# Patient Record
Sex: Female | Born: 1995 | Race: Black or African American | Hispanic: No | Marital: Married | Smoking: Never smoker
Health system: Southern US, Community
[De-identification: ages and names within clinical notes are randomized; demographics above are authoritative.]

## PROBLEM LIST (undated history)

## (undated) ENCOUNTER — Emergency Department (HOSPITAL_COMMUNITY): Payer: Medicaid Other

## (undated) DIAGNOSIS — D649 Anemia, unspecified: Secondary | ICD-10-CM

## (undated) DIAGNOSIS — T7840XA Allergy, unspecified, initial encounter: Secondary | ICD-10-CM

## (undated) HISTORY — DX: Anemia, unspecified: D64.9

## (undated) HISTORY — DX: Allergy, unspecified, initial encounter: T78.40XA

---
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
## 2018-01-15 DIAGNOSIS — Z3490 Encounter for supervision of normal pregnancy, unspecified, unspecified trimester: Secondary | ICD-10-CM | POA: Insufficient documentation

## 2019-06-15 ENCOUNTER — Ambulatory Visit
Admission: EM | Admit: 2019-06-15 | Discharge: 2019-06-15 | Disposition: A | Discharge status: Home / Self Care | Payer: Medicaid Other

## 2019-06-15 DIAGNOSIS — O219 Vomiting of pregnancy, unspecified: Secondary | ICD-10-CM | POA: Diagnosis not present

## 2019-06-15 MED ORDER — DOXYLAMINE-PYRIDOXINE 10-10 MG PO TBEC: DELAYED_RELEASE_TABLET | ORAL | 0 refills | Status: AC

## 2019-06-15 MED ORDER — ONDANSETRON 4 MG PO TBDP
4.0000 mg | ORAL_TABLET | Freq: Once | ORAL | Status: AC
  Administered 2019-06-15: 4 mg via ORAL

## 2019-06-15 NOTE — ED Triage Notes (Signed)
Pt states had a positive pregnancy test two weeks ago, been nausea ever since. States unable to eat or drink anything. Denies vomiting. States her first OB appt is Monday.

## 2019-06-15 NOTE — ED Provider Notes (Signed)
[facility]    CSN: [id number] Arrival date & time: 06/15/19  1935      History   Chief Complaint Chief Complaint  Patient presents with  . Nausea    HPI Miranda Harrison is a 24 y.o. female.   24 year old female comes in for nausea, vomiting after positive pregnancy test 2 weeks ago. She has appointment scheduled with OB in 4 days, was unable to be seen sooner. States had abdominal pain when vomiting. Otherwise no abdominal pain at rest. Denies current abdominal pain. Denies vaginal discharge, bleeding. Denies urinary symptoms such as frequency, dysuria, hematuria. LMP 04/02/2019.      Past Medical History:  Diagnosis Date  . Allergy     Patient Active Problem List   Diagnosis Date Noted  . Chronic daily headache 09/10/2015  . Sleep difficulties 09/10/2015    History reviewed. No pertinent surgical history.  OB History   No obstetric history on file.      Home Medications    Prior to Admission medications   Medication Sig Start Date End Date Taking? Authorizing Provider  Prenatal Vit-Fe Fumarate-FA (PRENATAL VITAMIN PO) Take by mouth.   Yes [provider]  Doxylamine-Pyridoxine 10-10 MG TBEC Two tablets at bedtime on day 1 and 2; if symptoms persist, take 1 tablet in morning and 2 tablets at bedtime on day 3; if symptoms persist, may increase to 1 tablet in morning, 1 tablet mid-afternoon, and 2 tablets at bedtime on day 4 (maximum: doxylamine 40 mg/pyridoxine 40 mg (4 tablets) per day). 06/15/19   Ok Edwards, PA-C    Family History Family History  Problem Relation Age of Onset  . Depression Mother   . Anxiety disorder Maternal Aunt   . Bipolar disorder Maternal Grandmother     Social History Social History   Tobacco Use  . Smoking status: Never Smoker  . Smokeless tobacco: Never Used  Substance Use Topics  . Alcohol use: No  . Drug use: No     Allergies   Patient has no known allergies.   Review of Systems Review of  Systems  Reason unable to perform ROS: See HPI as above.     Physical Exam Triage Vital Signs ED Triage Vitals [06/15/19 1947]  Enc Vitals Group     BP 103/68     Pulse Rate 88     Resp 16     Temp 98.4 F (36.9 C)     Temp Source Oral     SpO2 99 %     Weight      Height      Head Circumference      Peak Flow      Pain Score 0     Pain Loc      Pain Edu?      Excl. in [place]?    No data found.  Updated Vital Signs BP 103/68 (BP Location: Left Arm)   Pulse 88   Temp 98.4 F (36.9 C) (Oral)   Resp 16   LMP 04/02/2019   SpO2 99%   Visual Acuity Right Eye Distance:   Left Eye Distance:   Bilateral Distance:    Right Eye Near:   Left Eye Near:    Bilateral Near:     Physical Exam Constitutional:      General: She is not in acute distress.    Appearance: She is well-developed. She is not ill-appearing, toxic-appearing or diaphoretic.  HENT:     Head:  Normocephalic and atraumatic.  Eyes:     Conjunctiva/sclera: Conjunctivae normal.     Pupils: Pupils are equal, round, and reactive to light.  Cardiovascular:     Rate and Rhythm: Normal rate and regular rhythm.  Pulmonary:     Effort: Pulmonary effort is normal. No respiratory distress.     Comments: LCTAB Abdominal:     General: Bowel sounds are normal.     Palpations: Abdomen is soft.     Tenderness: There is no abdominal tenderness. There is no right CVA tenderness, left CVA tenderness, guarding or rebound.  Musculoskeletal:     Cervical back: Normal range of motion and neck supple.  Skin:    General: Skin is warm and dry.  Neurological:     Mental Status: She is alert and oriented to person, place, and time.  Psychiatric:        Behavior: Behavior normal.        Judgment: Judgment normal.      UC Treatments / Results  Labs (all labs ordered are listed, but only abnormal results are displayed) Labs Reviewed - No data to display  EKG   Radiology No results found.  Procedures Procedures  (including critical care time)  Medications Ordered in UC Medications  ondansetron (ZOFRAN-ODT) disintegrating tablet 4 mg (4 mg Oral Given 06/15/19 1950)    Initial Impression / Assessment and Plan / UC Course  I have reviewed the triage vital signs and the nursing notes.  Pertinent labs & imaging results that were available during my care of the patient were reviewed by me and considered in my medical decision making (see chart for details).    Zofran given by RN per protocol with good relief. Abdomen soft, nontender. Will have patient start diclegis, discussed using otc version if not covered by insurance. Return precautions given. Otherwise, patient to follow up as scheduled with OB for further evaluation and management needed.  Final Clinical Impressions(s) / UC Diagnoses   Final diagnoses:  Nausea and vomiting during pregnancy   ED Prescriptions    Medication Sig Dispense Auth. Provider   Doxylamine-Pyridoxine 10-10 MG TBEC Two tablets at bedtime on day 1 and 2; if symptoms persist, take 1 tablet in morning and 2 tablets at bedtime on day 3; if symptoms persist, may increase to 1 tablet in morning, 1 tablet mid-afternoon, and 2 tablets at bedtime on day 4 (maximum: doxylamine 40 mg/pyridoxine 40 mg (4 tablets) per day). 30 tablet Belinda Fisher, PA-C     PDMP not reviewed this encounter.   Belinda Fisher, PA-C 06/15/19 2115

## 2019-06-15 NOTE — Discharge Instructions (Signed)
Start diclegis as directed. Keep hydrated, urine should be clear to pale yellow in color. Follow up with OB as scheduled for further evaluation needed. If abdominal pain, nausea/vomiting not  controlled, weakness, dizziness, vaginal bleeding to to the women's ED for further evaluation.

## 2019-06-18 ENCOUNTER — Inpatient Hospital Stay (HOSPITAL_COMMUNITY)
Admission: AD | Admit: 2019-06-18 | Discharge: 2019-06-19 | Disposition: A | Discharge status: Home / Self Care | Payer: Medicaid Other | Attending: Obstetrics & Gynecology | Admitting: Obstetrics & Gynecology

## 2019-06-18 ENCOUNTER — Encounter (HOSPITAL_COMMUNITY): Payer: Self-pay

## 2019-06-18 ENCOUNTER — Inpatient Hospital Stay (HOSPITAL_COMMUNITY): Payer: Medicaid Other

## 2019-06-18 DIAGNOSIS — Z3A01 Less than 8 weeks gestation of pregnancy: Secondary | ICD-10-CM | POA: Diagnosis not present

## 2019-06-18 DIAGNOSIS — O98811 Other maternal infectious and parasitic diseases complicating pregnancy, first trimester: Secondary | ICD-10-CM | POA: Insufficient documentation

## 2019-06-18 DIAGNOSIS — B373 Candidiasis of vulva and vagina: Secondary | ICD-10-CM | POA: Insufficient documentation

## 2019-06-18 DIAGNOSIS — Z3A11 11 weeks gestation of pregnancy: Secondary | ICD-10-CM | POA: Diagnosis not present

## 2019-06-18 DIAGNOSIS — O219 Vomiting of pregnancy, unspecified: Secondary | ICD-10-CM | POA: Diagnosis not present

## 2019-06-18 DIAGNOSIS — O26891 Other specified pregnancy related conditions, first trimester: Secondary | ICD-10-CM | POA: Insufficient documentation

## 2019-06-18 DIAGNOSIS — O210 Mild hyperemesis gravidarum: Secondary | ICD-10-CM | POA: Insufficient documentation

## 2019-06-18 DIAGNOSIS — B3731 Acute candidiasis of vulva and vagina: Secondary | ICD-10-CM

## 2019-06-18 DIAGNOSIS — Z3491 Encounter for supervision of normal pregnancy, unspecified, first trimester: Secondary | ICD-10-CM

## 2019-06-18 DIAGNOSIS — Z79899 Other long term (current) drug therapy: Secondary | ICD-10-CM | POA: Insufficient documentation

## 2019-06-18 LAB — WET PREP, GENITAL
Clue Cells Wet Prep HPF POC: NONE SEEN
Sperm: NONE SEEN
Trich, Wet Prep: NONE SEEN

## 2019-06-18 LAB — URINALYSIS, ROUTINE W REFLEX MICROSCOPIC
Bilirubin Urine: NEGATIVE
Glucose, UA: NEGATIVE mg/dL
Hgb urine dipstick: NEGATIVE
Ketones, ur: 80 mg/dL — AB
Leukocytes,Ua: NEGATIVE
Nitrite: NEGATIVE
Protein, ur: NEGATIVE mg/dL
Specific Gravity, Urine: 1.027 (ref 1.005–1.030)
pH: 5 (ref 5.0–8.0)

## 2019-06-18 LAB — CBC
HCT: 34.6 % — ABNORMAL LOW (ref 36.0–46.0)
Hemoglobin: 11.3 g/dL — ABNORMAL LOW (ref 12.0–15.0)
MCH: 27.6 pg (ref 26.0–34.0)
MCHC: 32.7 g/dL (ref 30.0–36.0)
MCV: 84.4 fL (ref 80.0–100.0)
Platelets: 317 10*3/uL (ref 150–400)
RBC: 4.1 MIL/uL (ref 3.87–5.11)
RDW: 12.2 % (ref 11.5–15.5)
WBC: 6.2 10*3/uL (ref 4.0–10.5)
nRBC: 0 % (ref 0.0–0.2)

## 2019-06-18 LAB — ABO/RH: ABO/RH(D): O POS

## 2019-06-18 LAB — POCT PREGNANCY, URINE: Preg Test, Ur: POSITIVE — AB

## 2019-06-18 LAB — HCG, QUANTITATIVE, PREGNANCY: hCG, Beta Chain, Quant, S: 78807 m[IU]/mL — ABNORMAL HIGH (ref <5)

## 2019-06-18 MED ORDER — FAMOTIDINE IN NACL 20-0.9 MG/50ML-% IV SOLN
20.0000 mg | Freq: Once | INTRAVENOUS | Status: AC
  Administered 2019-06-18: 20 mg via INTRAVENOUS
  Filled 2019-06-18: qty 50

## 2019-06-18 MED ORDER — PROMETHAZINE HCL 25 MG/ML IJ SOLN
25.0000 mg | Freq: Once | INTRAMUSCULAR | Status: AC
  Administered 2019-06-18: 25 mg via INTRAVENOUS
  Filled 2019-06-18: qty 1

## 2019-06-18 MED ORDER — TERCONAZOLE 0.4 % VA CREA: 1.0000 | TOPICAL_CREAM | Freq: Every day | VAGINAL | 0 refills | Status: AC

## 2019-06-18 MED ORDER — LACTATED RINGERS IV BOLUS
1000.0000 mL | Freq: Once | INTRAVENOUS | Status: AC
  Administered 2019-06-18: 1000 mL via INTRAVENOUS

## 2019-06-18 MED ORDER — METOCLOPRAMIDE HCL 5 MG PO TABS: 5.0000 mg | ORAL_TABLET | Freq: Three times a day (TID) | ORAL | 1 refills | Status: DC | PRN

## 2019-06-18 NOTE — MAU Note (Signed)
Patient reports she was seen in urgent care for N/V on 2/25.  Unable to fill her prescription d/t her medicaid not going through yet.  Tried ginger and OTC meds without relief.  Unable to keep anything down today-feeling very weak.  Denies VB.  Some abdominal cramping in her lower abdomen.

## 2019-06-18 NOTE — Discharge Instructions (Signed)
Safe Medications in Pregnancy   Acne: Benzoyl Peroxide Salicylic Acid  Backache/Headache: Tylenol: 2 regular strength every 4 hours OR              2 Extra strength every 6 hours  Colds/Coughs/Allergies: Benadryl (alcohol free) 25 mg every 6 hours as needed Breath right strips Claritin Cepacol throat lozenges Chloraseptic throat spray Cold-Eeze- up to three times per day Cough drops, alcohol free Flonase (by prescription only) Guaifenesin Mucinex Robitussin DM (plain only, alcohol free) Saline nasal spray/drops Sudafed (pseudoephedrine) & Actifed ** use only after [redacted] weeks gestation and if you do not have high blood pressure Tylenol Vicks Vaporub Zinc lozenges Zyrtec   Constipation: Colace Ducolax suppositories Fleet enema Glycerin suppositories Metamucil Milk of magnesia Miralax Senokot Smooth move tea  Diarrhea: Kaopectate Imodium A-D  *NO pepto Bismol  Hemorrhoids: Anusol Anusol HC Preparation H Tucks  Indigestion: Tums Maalox Mylanta Zantac  Pepcid  Insomnia: Benadryl (alcohol free) 25mg every 6 hours as needed Tylenol PM Unisom, no Gelcaps  Leg Cramps: Tums MagGel  Nausea/Vomiting:  Bonine Dramamine Emetrol Ginger extract Sea bands Meclizine  Nausea medication to take during pregnancy:  Unisom (doxylamine succinate 25 mg tablets) Take one tablet daily at bedtime. If symptoms are not adequately controlled, the dose can be increased to a maximum recommended dose of two tablets daily (1/2 tablet in the morning, 1/2 tablet mid-afternoon and one at bedtime). Vitamin B6 100mg tablets. Take one tablet twice a day (up to 200 mg per day).  Skin Rashes: Aveeno products Benadryl cream or 25mg every 6 hours as needed Calamine Lotion 1% cortisone cream  Yeast infection: Gyne-lotrimin 7 Monistat 7  Gum/tooth pain: Anbesol  **If taking multiple medications, please check labels to avoid duplicating the same active ingredients **take  medication as directed on the label ** Do not exceed 4000 mg of tylenol in 24 hours **Do not take medications that contain aspirin or ibuprofen    Morning Sickness  Morning sickness is when a woman feels nauseous during pregnancy. This nauseous feeling may or may not come with vomiting. It often occurs in the morning, but it can be a problem at any time of day. Morning sickness is most common during the first trimester. In some cases, it may continue throughout pregnancy. Although morning sickness is unpleasant, it is usually harmless unless the woman develops severe and continual vomiting (hyperemesis gravidarum), a condition that requires more intense treatment. What are the causes? The exact cause of this condition is not known, but it seems to be related to normal hormonal changes that occur in pregnancy. What increases the risk? You are more likely to develop this condition if:  You experienced nausea or vomiting before your pregnancy.  You had morning sickness during a previous pregnancy.  You are pregnant with more than one baby, such as twins. What are the signs or symptoms? Symptoms of this condition include:  Nausea.  Vomiting. How is this diagnosed? This condition is usually diagnosed based on your signs and symptoms. How is this treated? In many cases, treatment is not needed for this condition. Making some changes to what you eat may help to control symptoms. Your health care provider may also prescribe or recommend:  Vitamin B6 supplements.  Anti-nausea medicines.  Ginger. Follow these instructions at home: Medicines  Take over-the-counter and prescription medicines only as told by your health care provider. Do not use any prescription, over-the-counter, or herbal medicines for morning sickness without first talking with your health care   health care provider.  Taking multivitamins before getting pregnant can prevent or decrease the severity of morning sickness in most  women. Eating and drinking  Eat a piece of dry toast or crackers before getting out of bed in the morning.  Eat 5 or 6 small meals a day.  Eat dry and bland foods, such as rice or a baked potato. Foods that are high in carbohydrates are often helpful.  Avoid greasy, fatty, and spicy foods.  Have someone cook for you if the smell of any food causes nausea and vomiting.  If you feel nauseous after taking prenatal vitamins, take the vitamins at night or with a snack.  Snack on protein foods between meals if you are hungry. Nuts, yogurt, and cheese are good options.  Drink fluids throughout the day.  Try ginger ale made with real ginger, ginger tea made from fresh grated ginger, or ginger candies. General instructions  Do not use any products that contain nicotine or tobacco, such as cigarettes and e-cigarettes. If you need help quitting, ask your health care provider.  Get an air purifier to keep the air in your house free of odors.  Get plenty of fresh air.  Try to avoid odors that trigger your nausea.  Consider trying these methods to help relieve symptoms: ? Wearing an acupressure wristband. These wristbands are often worn for seasickness. ? Acupuncture. Contact a health care provider if:  Your home remedies are not working and you need medicine.  You feel dizzy or light-headed.  You are losing weight. Get help right away if:  You have persistent and uncontrolled nausea and vomiting.  You faint.  You have severe pain in your abdomen. Summary  Morning sickness is when a woman feels nauseous during pregnancy. This nauseous feeling may or may not come with vomiting.  Morning sickness is most common during the first trimester.  It often occurs in the morning, but it can be a problem at any time of day.  In many cases, treatment is not needed for this condition. Making some changes to what you eat may help to control symptoms. This information is not intended to  replace advice given to you by your health care provider. Make sure you discuss any questions you have with your health care provider. Document Revised: 03/19/2017 Document Reviewed: 05/09/2016 Elsevier Patient Education  [place].     Constipation, Adult Constipation is when a person has fewer bowel movements in a week than normal, has difficulty having a bowel movement, or has stools that are dry, hard, or larger than normal. Constipation may be caused by an underlying condition. It may become worse with age if a person takes certain medicines and does not take in enough fluids. Follow these instructions at home: Eating and drinking   Eat foods that have a lot of fiber, such as fresh fruits and vegetables, whole grains, and beans.  Limit foods that are high in fat, low in fiber, or overly processed, such as french fries, hamburgers, cookies, candies, and soda.  Drink enough fluid to keep your urine clear or pale yellow. General instructions  Exercise regularly or as told by your health care provider.  Go to the restroom when you have the urge to go. Do not hold it in.  Take over-the-counter and prescription medicines only as told by your health care provider. These include any fiber supplements.  Practice pelvic floor retraining exercises, such as deep breathing while relaxing the lower abdomen and pelvic floor  relaxation during bowel movements.  Watch your condition for any changes.  Keep all follow-up visits as told by your health care provider. This is important. Contact a health care provider if:  You have pain that gets worse.  You have a fever.  You do not have a bowel movement after 4 days.  You vomit.  You are not hungry.  You lose weight.  You are bleeding from the anus.  You have thin, pencil-like stools. Get help right away if:  You have a fever and your symptoms suddenly get worse.  You leak stool or have blood in your stool.  Your abdomen  is bloated.  You have severe pain in your abdomen.  You feel dizzy or you faint. This information is not intended to replace advice given to you by your health care provider. Make sure you discuss any questions you have with your health care provider. Document Revised: 03/19/2017 Document Reviewed: 09/25/2015 Elsevier Patient Education  2020 [employer].

## 2019-06-18 NOTE — MAU Provider Note (Signed)
Chief Complaint: Emesis   First Provider Initiated Contact with Patient 06/18/19 2157     SUBJECTIVE HPI: Miranda Harrison is a 24 y.o. G2P1001 at [redacted]w[redacted]d by unsure LMP who presents to [facility] Admissions reporting n/v & abdominal cramping.  Has had nausea for the last few weeks but vomiting started today. States she has vomited countless times & hasn't been able to keep anything down. Has being using ginger candy & sea bands at home without relief. Has had mid lower abdominal cramping since vomiting began this morning. Denies fever/chills, diarrhea, dysuria, vaginal bleeding, or vaginal discharge. Last BM was a week ago; states this is normal for her. Plans on going to [facility] for prenatal care.   Location: abdomen Quality: cramping Severity: 7/10 on pain scale Duration: <1 day Timing: intermittent Modifying factors: none Associated signs and symptoms: n/v  Past Medical History:  Diagnosis Date  . Allergy    OB History  Gravida Para Term Preterm AB Living  2 1 1     1   SAB TAB Ectopic Multiple Live Births          1    # Outcome Date GA Lbr Len/2nd Weight Sex Delivery Anes PTL Lv  2 Current           1 Term 2019    F Vag-Spont   LIV   History reviewed. No pertinent surgical history. Social History   Socioeconomic History  . Marital status: Single    Spouse name: Not on file  . Number of children: Not on file  . Years of education: Not on file  . Highest education level: Not on file  Occupational History  . Not on file  Tobacco Use  . Smoking status: Never Smoker  . Smokeless tobacco: Never Used  Substance and Sexual Activity  . Alcohol use: No  . Drug use: No  . Sexual activity: Yes    Birth control/protection: None  Other Topics Concern  . Not on file  Social History Narrative  . Not on file   Social Determinants of Health   Financial Resource Strain:   . Difficulty of Paying Living Expenses: Not on file  Food Insecurity:   . Worried About Sales executive in the Last Year: Not on file  . Ran Out of Food in the Last Year: Not on file  Transportation Needs:   . Lack of Transportation (Medical): Not on file  . Lack of Transportation (Non-Medical): Not on file  Physical Activity:   . Days of Exercise per Week: Not on file  . Minutes of Exercise per Session: Not on file  Stress:   . Feeling of Stress : Not on file  Social Connections:   . Frequency of Communication with Friends and Family: Not on file  . Frequency of Social Gatherings with Friends and Family: Not on file  . Attends Religious Services: Not on file  . Active Member of Clubs or Organizations: Not on file  . Attends Archivist Meetings: Not on file  . Marital Status: Not on file  Intimate Partner Violence:   . Fear of Current or Ex-Partner: Not on file  . Emotionally Abused: Not on file  . Physically Abused: Not on file  . Sexually Abused: Not on file   Family History  Problem Relation Age of Onset  . Depression Mother   . Anxiety disorder Maternal Aunt   . Bipolar disorder Maternal Grandmother    No current facility-administered medications on file  prior to encounter.   Current Outpatient Medications on File Prior to Encounter  Medication Sig Dispense Refill  . Prenatal Vit-Fe Fumarate-FA (PRENATAL VITAMIN PO) Take by mouth.    . Doxylamine-Pyridoxine 10-10 MG TBEC Two tablets at bedtime on day 1 and 2; if symptoms persist, take 1 tablet in morning and 2 tablets at bedtime on day 3; if symptoms persist, may increase to 1 tablet in morning, 1 tablet mid-afternoon, and 2 tablets at bedtime on day 4 (maximum: doxylamine 40 mg/pyridoxine 40 mg (4 tablets) per day). 30 tablet 0   No Known Allergies  I have reviewed patient's Past Medical Hx, Surgical Hx, Family Hx, Social Hx, medications and allergies.   Review of Systems  Constitutional: Negative.   Gastrointestinal: Positive for abdominal pain, constipation, nausea and vomiting. Negative for blood in  stool and diarrhea.  Genitourinary: Negative.     OBJECTIVE Patient Vitals for the past 24 hrs:  BP Temp Pulse Resp SpO2 Weight  06/18/19 2103 105/68 98.6 F (37 C) 86 15 100 % 50.8 kg   Constitutional: Well-developed, well-nourished female in no acute distress.  Cardiovascular: normal rate & rhythm, no murmur Respiratory: normal rate and effort. Lung sounds clear throughout GI: Abd soft, non-tender, Pos BS x 4. No guarding or rebound tenderness MS: Extremities nontender, no edema, normal ROM Neurologic: Alert and oriented x 4.      LAB RESULTS Results for orders placed or performed during the hospital encounter of 06/18/19 (from the past 24 hour(s))  Urinalysis, Routine w reflex microscopic     Status: Abnormal   Collection Time: 06/18/19  9:05 PM  Result Value Ref Range   Color, Urine YELLOW YELLOW   APPearance CLEAR CLEAR   Specific Gravity, Urine 1.027 1.005 - 1.030   pH 5.0 5.0 - 8.0   Glucose, UA NEGATIVE NEGATIVE mg/dL   Hgb urine dipstick NEGATIVE NEGATIVE   Bilirubin Urine NEGATIVE NEGATIVE   Ketones, ur 80 (A) NEGATIVE mg/dL   Protein, ur NEGATIVE NEGATIVE mg/dL   Nitrite NEGATIVE NEGATIVE   Leukocytes,Ua NEGATIVE NEGATIVE  Pregnancy, urine POC     Status: Abnormal   Collection Time: 06/18/19  9:11 PM  Result Value Ref Range   Preg Test, Ur POSITIVE (A) NEGATIVE  ABO/Rh     Status: None   Collection Time: 06/18/19 10:14 PM  Result Value Ref Range   ABO/RH(D) O POS    No rh immune globuloin      NOT A RH IMMUNE GLOBULIN CANDIDATE, PT RH POSITIVE Performed at [facility], [address], [place], [place] [address]   Wet prep, genital     Status: Abnormal   Collection Time: 06/18/19 10:23 PM  Result Value Ref Range   Yeast Wet Prep HPF POC PRESENT (A) NONE SEEN   Trich, Wet Prep NONE SEEN NONE SEEN   Clue Cells Wet Prep HPF POC NONE SEEN NONE SEEN   WBC, Wet Prep HPF POC MANY (A) NONE SEEN   Sperm NONE SEEN   CBC     Status: Abnormal   Collection  Time: 06/18/19 10:36 PM  Result Value Ref Range   WBC 6.2 4.0 - 10.5 K/uL   RBC 4.10 3.87 - 5.11 MIL/uL   Hemoglobin 11.3 (L) 12.0 - 15.0 g/dL   HCT 37.1 (L) 69.6 - 78.9 %   MCV 84.4 80.0 - 100.0 fL   MCH 27.6 26.0 - 34.0 pg   MCHC 32.7 30.0 - 36.0 g/dL   RDW 12.2  11.5 - 15.5 %   Platelets 317 150 - 400 K/uL   nRBC 0.0 0.0 - 0.2 %  hCG, quantitative, pregnancy     Status: Abnormal   Collection Time: 06/18/19 10:36 PM  Result Value Ref Range   hCG, Beta Chain, Quant, S 78,807 (H) <5 mIU/mL    IMAGING US OB Comp Less 14 Wks  Result Date: 06/18/2019 CLINICAL DATA:  Abdominal pain EXAM: OBSTETRIC <14 WK ULTRASOUND TECHNIQUE: Transabdominal ultrasound was performed for evaluation of the gestation as well as the maternal uterus and adnexal regions. COMPARISON:  None. FINDINGS: Intrauterine gestational sac: Single Yolk sac:  Visualized. Embryo:  Visualized. Cardiac Activity: Visualized. Heart Rate: 110 bpm CRL:   7.4 mm   6 w 4 d                  [place] EDC: 02/07/2020 Subchorionic hemorrhage:  Small subchorionic hemorrhage. Maternal uterus/adnexae: Normal appearing ovaries. IMPRESSION: Single live intrauterine pregnancy measuring 6 weeks 4 days. Electronically Signed   By: Jonna Clark M.D.   On: 06/18/2019 23:16    MAU COURSE Orders Placed This Encounter  Procedures  . Wet prep, genital  . US OB Comp Less 14 Wks  . Urinalysis, Routine w reflex microscopic  . CBC  . hCG, quantitative, pregnancy  . Pregnancy, urine POC  . ABO/Rh  . Discharge patient   Meds ordered this encounter  Medications  . lactated ringers bolus 1,000 mL  . promethazine (PHENERGAN) injection 25 mg  . famotidine (PEPCID) IVPB 20 mg premix  . terconazole (TERAZOL 7) 0.4 % vaginal cream    Sig: Place 1 applicator vaginally at bedtime. Use for seven days    Dispense:  45 g    Refill:  0    Order Specific Question:   Supervising Provider    Answer:   DOVE, MYRA C [[id number]]  . metoCLOPramide (REGLAN) 5 MG tablet     Sig: Take 1 tablet (5 mg total) by mouth every 8 (eight) hours as needed for nausea.    Dispense:  30 tablet    Refill:  1    Order Specific Question:   Supervising Provider    Answer:   DOVE, MYRA C [[id number]]    MDM +UPT UA, wet prep, GC/chlamydia, CBC, ABO/Rh, quant hCG, and [place] today to rule out ectopic pregnancy which can be life threatening.   Wet prep positive for yeast. Will prescribe terazol.   Ultrasound shows live IUP measuring [redacted]w[redacted]d, EDD updated. Pregnancy verification appointment scheduled for tomorrow has been cancelled & patient instructed to call [facility] to schedule her new ob appointment  IV fluids, phenergan, & pepcid given in MAU. Pt reports improvement in symptoms & not observed vomiting while here. Will send home with prescription reglan (prefers medication that won't make her sleepy).   ASSESSMENT 1. Nausea and vomiting during pregnancy prior to [redacted] weeks gestation   2. Abdominal pain during pregnancy in first trimester   3. Normal IUP (intrauterine pregnancy) on prenatal ultrasound, first trimester   4. [redacted] weeks gestation of pregnancy   5. Vaginal yeast infection     PLAN Discharge home in stable condition. Discussed reasons to return to MAU Rx terazol & reglan  Follow-up Information    [facility] Follow up.   Why: schedule new ob appointment Contact information: [address] [place] [address] [phone]         Allergies as of 06/18/2019   No Known Allergies  Medication List    TAKE these medications   Doxylamine-Pyridoxine 10-10 MG Tbec Two tablets at bedtime on day 1 and 2; if symptoms persist, take 1 tablet in morning and 2 tablets at bedtime on day 3; if symptoms persist, may increase to 1 tablet in morning, 1 tablet mid-afternoon, and 2 tablets at bedtime on day 4 (maximum: doxylamine 40 mg/pyridoxine 40 mg (4 tablets) per day).   metoCLOPramide 5 MG tablet Commonly known as: REGLAN Take 1  tablet (5 mg total) by mouth every 8 (eight) hours as needed for nausea.   PRENATAL VITAMIN PO Take by mouth.   terconazole 0.4 % vaginal cream Commonly known as: TERAZOL 7 Place 1 applicator vaginally at bedtime. Use for seven days        Judeth Horn, NP 06/18/2019  11:56 PM

## 2019-06-19 LAB — GC/CHLAMYDIA PROBE AMP (~~LOC~~) NOT AT ARMC
Chlamydia: NEGATIVE
Comment: NEGATIVE
Comment: NORMAL
Neisseria Gonorrhea: NEGATIVE

## 2019-06-25 ENCOUNTER — Encounter (HOSPITAL_COMMUNITY): Payer: Self-pay | Admitting: Obstetrics & Gynecology

## 2019-06-25 ENCOUNTER — Other Ambulatory Visit: Payer: Self-pay

## 2019-06-25 ENCOUNTER — Inpatient Hospital Stay (HOSPITAL_COMMUNITY)
Admission: AD | Admit: 2019-06-25 | Discharge: 2019-06-25 | Disposition: A | Discharge status: Home / Self Care | Payer: Medicaid Other | Source: Ambulatory Visit | Attending: Obstetrics & Gynecology | Admitting: Obstetrics & Gynecology

## 2019-06-25 DIAGNOSIS — O219 Vomiting of pregnancy, unspecified: Secondary | ICD-10-CM

## 2019-06-25 DIAGNOSIS — O210 Mild hyperemesis gravidarum: Secondary | ICD-10-CM | POA: Diagnosis not present

## 2019-06-25 DIAGNOSIS — F1290 Cannabis use, unspecified, uncomplicated: Secondary | ICD-10-CM | POA: Insufficient documentation

## 2019-06-25 DIAGNOSIS — O99321 Drug use complicating pregnancy, first trimester: Secondary | ICD-10-CM | POA: Diagnosis not present

## 2019-06-25 DIAGNOSIS — F1910 Other psychoactive substance abuse, uncomplicated: Secondary | ICD-10-CM

## 2019-06-25 DIAGNOSIS — Z3A01 Less than 8 weeks gestation of pregnancy: Secondary | ICD-10-CM | POA: Diagnosis not present

## 2019-06-25 LAB — URINALYSIS, ROUTINE W REFLEX MICROSCOPIC
Bilirubin Urine: NEGATIVE
Glucose, UA: NEGATIVE mg/dL
Hgb urine dipstick: NEGATIVE
Ketones, ur: 20 mg/dL — AB
Leukocytes,Ua: NEGATIVE
Nitrite: NEGATIVE
Protein, ur: 30 mg/dL — AB
Specific Gravity, Urine: 1.024 (ref 1.005–1.030)
pH: 6 (ref 5.0–8.0)

## 2019-06-25 LAB — RAPID URINE DRUG SCREEN, HOSP PERFORMED
Amphetamines: NOT DETECTED
Barbiturates: NOT DETECTED
Benzodiazepines: NOT DETECTED
Cocaine: NOT DETECTED
Opiates: NOT DETECTED
Tetrahydrocannabinol: POSITIVE — AB

## 2019-06-25 MED ORDER — PROMETHAZINE HCL 25 MG PO TABS: 25.0000 mg | ORAL_TABLET | Freq: Four times a day (QID) | ORAL | 0 refills | Status: AC | PRN

## 2019-06-25 MED ORDER — ONDANSETRON 4 MG PO TBDP
4.0000 mg | ORAL_TABLET | Freq: Once | ORAL | Status: AC
  Administered 2019-06-25: 4 mg via ORAL
  Filled 2019-06-25: qty 1

## 2019-06-25 MED ORDER — LACTATED RINGERS IV BOLUS
1000.0000 mL | Freq: Once | INTRAVENOUS | Status: AC
  Administered 2019-06-25: 1000 mL via INTRAVENOUS

## 2019-06-25 NOTE — MAU Provider Note (Signed)
History     CSN: [id number]  Arrival date and time: 06/25/19 2211   First Provider Initiated Contact with Patient 06/25/19 2250      Chief Complaint  Patient presents with  . Emesis During Pregnancy   HPI Miranda Harrison is a 24 y.o. G2P1001 at [redacted]w[redacted]d who presents to MAU with chief complaint of nausea and vomiting. Patient was previously evaluated in MAU for same complaint and prescribed Reglan. Patient states the Reglan has stopped helping and she is unable to tolerate anything PO. Patient endorses multiple attempts for solid food in the past two days including steak, potato skins, fruit smoothies, bran waffle and french fries.   She denies vaginal bleeding, abdominal pain, abnormal vaginal discharge, fever, falls, or recent illness.   She plans to pursue prenatal care with [facility].  OB History    Gravida  2   Para  1   Term  1   Preterm      AB      Living  1     SAB      TAB      Ectopic      Multiple      Live Births  1           Past Medical History:  Diagnosis Date  . Allergy     History reviewed. No pertinent surgical history.  Family History  Problem Relation Age of Onset  . Depression Mother   . Anxiety disorder Maternal Aunt   . Bipolar disorder Maternal Grandmother     Social History   Tobacco Use  . Smoking status: Never Smoker  . Smokeless tobacco: Never Used  Substance Use Topics  . Alcohol use: No  . Drug use: No    Allergies: No Known Allergies  Medications Prior to Admission  Medication Sig Dispense Refill Last Dose  . metoCLOPramide (REGLAN) 5 MG tablet Take 1 tablet (5 mg total) by mouth every 8 (eight) hours as needed for nausea. 30 tablet 1 06/24/2019 at 0800  . Prenatal Vit-Fe Fumarate-FA (PRENATAL VITAMIN PO) Take by mouth.   06/25/2019 at Unknown time  . terconazole (TERAZOL 7) 0.4 % vaginal cream Place 1 applicator vaginally at bedtime. Use for seven days 45 g 0 06/25/2019 at Unknown time  . Doxylamine-Pyridoxine  10-10 MG TBEC Two tablets at bedtime on day 1 and 2; if symptoms persist, take 1 tablet in morning and 2 tablets at bedtime on day 3; if symptoms persist, may increase to 1 tablet in morning, 1 tablet mid-afternoon, and 2 tablets at bedtime on day 4 (maximum: doxylamine 40 mg/pyridoxine 40 mg (4 tablets) per day). 30 tablet 0     Review of Systems  Constitutional: Negative for chills and fever.  Gastrointestinal: Positive for nausea and vomiting. Negative for abdominal pain.  Genitourinary: Negative for difficulty urinating, dysuria, vaginal bleeding and vaginal discharge.  Musculoskeletal: Negative for back pain.  Neurological: Negative for dizziness, syncope, weakness and light-headedness.  All other systems reviewed and are negative.  Physical Exam   Blood pressure 99/64, pulse 97, temperature 98.9 F (37.2 C), temperature source Oral, resp. rate 18, height 4\' 11"  (1.499 m), weight 49 kg, last menstrual period 04/02/2019, SpO2 100 %.  Physical Exam  Nursing note and vitals reviewed. Constitutional: She is oriented to person, place, and time. She appears well-developed and well-nourished.  Cardiovascular: Normal rate.  Respiratory: Effort normal and breath sounds normal.  GI: Soft. Bowel sounds are normal. She exhibits no distension. There  is no abdominal tenderness. There is no rebound, no guarding and no CVA tenderness.  Active BS x 4  Genitourinary:    Genitourinary Comments: Not performed due to nature of chief complaint   Neurological: She is alert and oriented to person, place, and time.  Skin: Skin is warm and dry.  Psychiatric: She has a normal mood and affect. Her behavior is normal. Judgment and thought content normal.    MAU Course/MDM  Procedures  --Discussed with patient that attempted foods are too flavorful, heavy, rich for someone with nausea and vomiting in first trimester. Focus on simple bland carbs, small servings of protein especially before bed, solid before  liquid.  Patient Vitals for the past 24 hrs:  BP Temp Temp src Pulse Resp SpO2 Height Weight  06/25/19 2353 100/64 - - 87 - - - -  06/25/19 2247 (!) 82/46 - - 76 - - - -  06/25/19 2234 99/64 98.9 F (37.2 C) Oral 97 18 100 % 4\' 11"  (1.499 m) 49 kg   Results for orders placed or performed during the hospital encounter of 06/25/19 (from the past 24 hour(s))  Urinalysis, Routine w reflex microscopic     Status: Abnormal   Collection Time: 06/25/19 10:49 PM  Result Value Ref Range   Color, Urine YELLOW YELLOW   APPearance HAZY (A) CLEAR   Specific Gravity, Urine 1.024 1.005 - 1.030   pH 6.0 5.0 - 8.0   Glucose, UA NEGATIVE NEGATIVE mg/dL   Hgb urine dipstick NEGATIVE NEGATIVE   Bilirubin Urine NEGATIVE NEGATIVE   Ketones, ur 20 (A) NEGATIVE mg/dL   Protein, ur 30 (A) NEGATIVE mg/dL   Nitrite NEGATIVE NEGATIVE   Leukocytes,Ua NEGATIVE NEGATIVE   RBC / HPF 0-5 0 - 5 RBC/hpf   WBC, UA 0-5 0 - 5 WBC/hpf   Bacteria, UA RARE (A) NONE SEEN   Squamous Epithelial / LPF 0-5 0 - 5   Mucus PRESENT    Hyaline Casts, UA PRESENT   Rapid urine drug screen (hospital performed)     Status: Abnormal   Collection Time: 06/25/19 10:58 PM  Result Value Ref Range   Opiates NONE DETECTED NONE DETECTED   Cocaine NONE DETECTED NONE DETECTED   Benzodiazepines NONE DETECTED NONE DETECTED   Amphetamines NONE DETECTED NONE DETECTED   Tetrahydrocannabinol POSITIVE (A) NONE DETECTED   Barbiturates NONE DETECTED NONE DETECTED   Meds ordered this encounter  Medications  . ondansetron (ZOFRAN-ODT) disintegrating tablet 4 mg  . lactated ringers bolus 1,000 mL  . promethazine (PHENERGAN) 25 MG tablet    Sig: Take 1 tablet (25 mg total) by mouth every 6 (six) hours as needed for nausea or vomiting.    Dispense:  30 tablet    Refill:  0    Order Specific Question:   Supervising Provider    Answer:   08/25/19 H [[id number]]   Assessment and Plan  --24 y.o. G2P1001 at [redacted]w[redacted]d --N/V in first trimester --+ THC,  concern for HG --Heavily underscored restructuring diet --D/C Reglan, start Phenergan as second line after B6+Unisom --Discharge home in stable condition  [redacted]w[redacted]d, CNM 06/26/2019, 12:51 AM

## 2019-06-25 NOTE — MAU Note (Signed)
Vomiting hasn't got better from last week, was seen here then.  Reglan didn't work.  Vomit x 6 times today. Went out eat on Saturday night, bites of potato skins and steak- that came up at 1 am, Smoothie and fruit this morning, that came up, bran waffle at lunch- vomited, tried fries at 2 pm, ate rice for supper - all came back up.  Ginger ale and water - vomited at 9pm.  Last Tried Reglan yesterday. No abd pain.

## 2019-06-25 NOTE — Discharge Instructions (Signed)
Nausea and Vomiting in Pregnancy How is this treated? This condition is managed by controlling symptoms. This may include:  Following an eating plan. This can help lessen nausea and vomiting.  Taking prescription medicines. An eating plan and medicines are often used together to help control symptoms. If medicines do not help relieve nausea and vomiting, you may need to receive fluids through an IV at the hospital. Follow these instructions at home: Eating and drinking   Avoid the following: ? Drinking fluids with meals. Try not to drink anything during the 30 minutes before and after your meals. ? Drinking more than 1 cup of fluid at a time. ? Eating foods that trigger your symptoms. These may include spicy foods, coffee, high-fat foods, very sweet foods, and acidic foods. ? Skipping meals. Nausea can be more intense on an empty stomach. If you cannot tolerate food, do not force it. Try sucking on ice chips or other frozen items and make up for missed calories later. ? Lying down within 2 hours after eating. ? Being exposed to environmental triggers. These may include food smells, smoky rooms, closed spaces, rooms with strong smells, warm or humid places, overly loud and noisy rooms, and rooms with motion or flickering lights. Try eating meals in a well-ventilated area that is free of strong smells. ? Quick and sudden changes in your movement. ? Taking iron pills and multivitamins that contain iron. If you take prescription iron pills, do not stop taking them unless your health care provider approves. ? Preparing food. The smell of food can spoil your appetite or trigger nausea.  To help relieve your symptoms: ? Listen to your body. Everyone is different and has different preferences. Find what works best for you. ? Eat and drink slowly. ? Eat 5-6 small meals daily instead of 3 large meals. Eating small meals and snacks can help you avoid an empty stomach. ? In the morning, before getting  out of bed, eat a couple of crackers to avoid moving around on an empty stomach. ? Try eating starchy foods as these are usually tolerated well. Examples include cereal, toast, bread, potatoes, pasta, rice, and pretzels. ? Include at least 1 serving of protein with your meals and snacks. Protein options include lean meats, poultry, seafood, beans, nuts, nut butters, eggs, cheese, and yogurt. ? Try eating a protein-rich snack before bed. Examples of a protein-rick snack include cheese and crackers or a peanut butter sandwich made with 1 slice of whole-wheat bread and 1 tsp (5 g) of peanut butter. ? Eat or suck on things that have ginger in them. It may help relieve nausea. Add  tsp ground ginger to hot tea or choose ginger tea. ? Try drinking 100% fruit juice or an electrolyte drink. An electrolyte drink contains sodium, potassium, and chloride. ? Drink fluids that are cold, clear, and carbonated or sour. Examples include lemonade, ginger ale, lemon-lime soda, ice water, and sparkling water. ? Brush your teeth or use a mouth rinse after meals. ? Talk with your health care provider about starting a supplement of vitamin B6. General instructions  Take over-the-counter and prescription medicines only as told by your health care provider.  Follow instructions from your health care provider about eating or drinking restrictions.  Continue to take your prenatal vitamins as told by your health care provider. If you are having trouble taking your prenatal vitamins, talk with your health care provider about different options.  Keep all follow-up and pre-birth (prenatal) visits as told  by your health care provider. This is important. Contact a health care provider if:  You have pain in your abdomen.  You have a severe headache.  You have vision problems.  You are losing weight.  You feel weak or dizzy. Get help right away if:  You cannot drink fluids without vomiting.  You vomit blood.  You  have constant nausea and vomiting.  You are very weak.  You faint.  You have a fever and your symptoms suddenly get worse. Summary  Making some changes to your eating habits may help relieve nausea and vomiting.  This condition may be managed with medicine.  If medicines do not help relieve nausea and vomiting, you may need to receive fluids through an IV at the hospital. This information is not intended to replace advice given to you by your health care provider. Make sure you discuss any questions you have with your health care provider. Document Revised: 04/26/2017 Document Reviewed: 12/04/2015 Elsevier Patient Education  [place].

## 2019-06-26 DIAGNOSIS — F1910 Other psychoactive substance abuse, uncomplicated: Secondary | ICD-10-CM

## 2019-06-26 DIAGNOSIS — O99321 Drug use complicating pregnancy, first trimester: Secondary | ICD-10-CM

## 2019-06-26 HISTORY — DX: Drug use complicating pregnancy, first trimester: O99.321

## 2019-06-26 HISTORY — DX: Other psychoactive substance abuse, uncomplicated: F19.10

## 2019-07-25 ENCOUNTER — Ambulatory Visit: Payer: Medicaid Other

## 2019-07-25 DIAGNOSIS — Z3490 Encounter for supervision of normal pregnancy, unspecified, unspecified trimester: Secondary | ICD-10-CM

## 2019-07-25 MED ORDER — BLOOD PRESSURE MONITOR KIT: 1.0000 | PACK | 0 refills | Status: AC

## 2019-07-25 NOTE — Progress Notes (Signed)
PRENATAL INTAKE SUMMARY  Ms. Miranda Harrison presents today New OB Nurse Interview.  OB History    Gravida  2   Para  1   Term  1   Preterm      AB      Living  1     SAB      TAB      Ectopic      Multiple      Live Births  1          I have reviewed the patient's medical, obstetrical, social, and family histories, medications, and available lab results.  SUBJECTIVE She complains of nausea with vomiting.  OBJECTIVE Initial Physical Exam (New OB)   ASSESSMENT Normal pregnancy  PLAN Prenatal care B/P Cuff ordered  Enrolled in babyscripts

## 2019-08-01 ENCOUNTER — Encounter: Payer: Self-pay | Admitting: Advanced Practice Midwife

## 2019-08-01 ENCOUNTER — Ambulatory Visit (INDEPENDENT_AMBULATORY_CARE_PROVIDER_SITE_OTHER): Payer: Medicaid Other | Admitting: Advanced Practice Midwife

## 2019-08-01 ENCOUNTER — Other Ambulatory Visit (HOSPITAL_COMMUNITY)
Admission: RE | Admit: 2019-08-01 | Discharge: 2019-08-01 | Disposition: A | Discharge status: Home / Self Care | Payer: Medicaid Other | Source: Ambulatory Visit | Attending: Advanced Practice Midwife | Admitting: Advanced Practice Midwife

## 2019-08-01 ENCOUNTER — Other Ambulatory Visit: Payer: Self-pay

## 2019-08-01 DIAGNOSIS — Z3480 Encounter for supervision of other normal pregnancy, unspecified trimester: Secondary | ICD-10-CM | POA: Insufficient documentation

## 2019-08-01 DIAGNOSIS — Z3481 Encounter for supervision of other normal pregnancy, first trimester: Secondary | ICD-10-CM

## 2019-08-01 NOTE — Progress Notes (Signed)
Pt presents for NOB visit Pap due today  Pt has questions about dating

## 2019-08-01 NOTE — Progress Notes (Signed)
Subjective:   Miranda Harrison is a 24 y.o. G2P1001 at 61w6dby early ultrasound being seen today for her first obstetrical visit.  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 Patient does intend to breast feed. Pregnancy history fully reviewed.  Patient reports no complaints.  HISTORY: OB History  Gravida Para Term Preterm AB Living  '2 1 1 ' 0 0 1  SAB TAB Ectopic Multiple Live Births  0 0 0 0 1    # Outcome Date GA Lbr Len/2nd Weight Sex Delivery Anes PTL Lv  2 Current           1 Term 2019    F Vag-Spont   LIV   Past Medical History:  Diagnosis Date  . Allergy   . Anemia    History reviewed. No pertinent surgical history. Family History  Problem Relation Age of Onset  . Depression Mother   . Ovarian cancer Mother   . Anxiety disorder Maternal Aunt   . Bipolar disorder Maternal Grandmother   . Ovarian cancer Maternal Grandmother    Social History   Tobacco Use  . Smoking status: Never Smoker  . Smokeless tobacco: Never Used  Substance Use Topics  . Alcohol use: No  . Drug use: Yes    Types: Marijuana    Comment: none since confirming +UPT    No Known Allergies Current Outpatient Medications on File Prior to Visit  Medication Sig Dispense Refill  . Blood Pressure Monitor KIT 1 Device by Does not apply route once a week. To be monitored Regularly at home. 1 kit 0  . Doxylamine-Pyridoxine 10-10 MG TBEC Two tablets at bedtime on day 1 and 2; if symptoms persist, take 1 tablet in morning and 2 tablets at bedtime on day 3; if symptoms persist, may increase to 1 tablet in morning, 1 tablet mid-afternoon, and 2 tablets at bedtime on day 4 (maximum: doxylamine 40 mg/pyridoxine 40 mg (4 tablets) per day). (Patient not  taking: Reported on 07/25/2019) 30 tablet 0  . Prenatal Vit-Fe Fumarate-FA (PRENATAL VITAMIN PO) Take by mouth.    . promethazine (PHENERGAN) 25 MG tablet Take 1 tablet (25 mg total) by mouth every 6 (six) hours as needed for nausea or vomiting. (Patient not taking: Reported on 07/25/2019) 30 tablet 0  . terconazole (TERAZOL 7) 0.4 % vaginal cream Place 1 applicator vaginally at bedtime. Use for seven days (Patient not taking: Reported on 07/25/2019) 45 g 0   No current facility-administered medications on file prior to visit.     Indications for ASA therapy (per uptodate) One of the following: Previous pregnancy with preeclampsia, especially early onset and with an adverse outcome No Multifetal gestation No Chronic hypertension No Type 1 or 2 diabetes mellitus No Chronic kidney disease No Autoimmune disease (antiphospholipid syndrome, systemic lupus erythematosus) No   Two or more of the following: Nulliparity No Obesity (body mass index >30 kg/m2) No Family history of preeclampsia in mother or sister No Age ?35 years No Sociodemographic characteristics (African American race, low socioeconomic level) Yes Personal risk factors (eg, previous pregnancy with low birth weight or small for gestational age infant, previous adverse pregnancy outcome [eg, stillbirth], interval >10 years between pregnancies) No   Indications for early 1 hour GTT (per uptodate)  BMI >25 (>23 in [place]  women): No      Exam   Vitals:   08/01/19 1427  BP: 105/74  Pulse: (!) 105  Weight: 115 lb 6.4 oz (52.3 kg)   Fetal Heart Rate (bpm): 160  VS reviewed, nursing note reviewed,  Constitutional: well developed, well nourished, no distress HEENT: normocephalic CV: normal rate Pulm/chest wall: normal effort Breast Exam:  Deferred Abdomen: soft Neuro: alert and oriented x 3 Skin: warm, dry Psych: affect normal Pelvic exam: Cervix pink, visually closed, without lesion, scant white creamy discharge, vaginal  walls and external genitalia normal    Assessment:   Pregnancy: G2P1001 Patient Active Problem List   Diagnosis Date Noted  . Supervision of normal pregnancy, antepartum 07/25/2019  . 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 Chronic daily headache 09/10/2015  . Sleep difficulties 09/10/2015     Plan:  1. Supervision of other normal pregnancy, antepartum --Anticipatory guidance about next visits/weeks of pregnancy given.  - Enroll Patient in Babyscripts - Babyscripts Schedule Optimization - Cytology - PAP( [facility]) - Cervicovaginal ancillary only( [facility]) - Culture, OB Urine - Obstetric Panel, Including HIV - Genetic Screening - Hepatitis C Antibody - [place] MFM OB COMP + 14 WK; Future - AFP, Serum, Open Spina Bifida; Future    Initial labs drawn. Continue prenatal vitamins. Discussed and offered genetic screening options, including Quad screen/AFP, NIPS testing, and option to decline testing. Benefits/risks/alternatives reviewed. Pt aware that anatomy US is form of genetic screening with lower accuracy in detecting trisomies than blood work.  Pt chooses genetic screening today. NIPS: ordered. Ultrasound discussed; fetal anatomic survey: ordered. Problem list reviewed and updated. The nature of [facility] with multiple MDs and other Advanced Practice Providers was explained to patient; also emphasized that residents, students are part of our team. Routine obstetric precautions reviewed. Return in about 6 weeks (around 09/12/2019).   Fatima Blank, CNM 08/01/19 3:29 PM

## 2019-08-01 NOTE — Patient Instructions (Signed)

## 2019-08-02 LAB — OBSTETRIC PANEL, INCLUDING HIV
Antibody Screen: NEGATIVE
Basophils Absolute: 0 10*3/uL (ref 0.0–0.2)
Basos: 0 %
EOS (ABSOLUTE): 0.1 10*3/uL (ref 0.0–0.4)
Eos: 1 %
HIV Screen 4th Generation wRfx: NONREACTIVE
Hematocrit: 33 % — ABNORMAL LOW (ref 34.0–46.6)
Hemoglobin: 10.6 g/dL — ABNORMAL LOW (ref 11.1–15.9)
Hepatitis B Surface Ag: NEGATIVE
Immature Grans (Abs): 0 10*3/uL (ref 0.0–0.1)
Immature Granulocytes: 1 %
Lymphocytes Absolute: 2 10*3/uL (ref 0.7–3.1)
Lymphs: 32 %
MCH: 27.9 pg (ref 26.6–33.0)
MCHC: 32.1 g/dL (ref 31.5–35.7)
MCV: 87 fL (ref 79–97)
Monocytes Absolute: 0.6 10*3/uL (ref 0.1–0.9)
Monocytes: 10 %
Neutrophils Absolute: 3.6 10*3/uL (ref 1.4–7.0)
Neutrophils: 56 %
Platelets: 315 10*3/uL (ref 150–450)
RBC: 3.8 x10E6/uL (ref 3.77–5.28)
RDW: 12.6 % (ref 11.7–15.4)
RPR Ser Ql: NONREACTIVE
Rh Factor: POSITIVE
Rubella Antibodies, IGG: 2.33 index (ref >0.99)
WBC: 6.3 10*3/uL (ref 3.4–10.8)

## 2019-08-02 LAB — HEPATITIS C ANTIBODY: Hep C Virus Ab: <0.1 s/co ratio (ref 0.0–0.9)

## 2019-08-03 LAB — CULTURE, OB URINE

## 2019-08-03 LAB — CERVICOVAGINAL ANCILLARY ONLY
Bacterial Vaginitis (gardnerella): POSITIVE — AB
Candida Glabrata: NEGATIVE
Candida Vaginitis: NEGATIVE
Chlamydia: NEGATIVE
Comment: NEGATIVE
Comment: NEGATIVE
Comment: NEGATIVE
Comment: NEGATIVE
Comment: NEGATIVE
Comment: NORMAL
Neisseria Gonorrhea: NEGATIVE
Trichomonas: NEGATIVE

## 2019-08-03 LAB — CYTOLOGY - PAP
Comment: NEGATIVE
Diagnosis: NEGATIVE
High risk HPV: NEGATIVE

## 2019-08-03 LAB — URINE CULTURE, OB REFLEX

## 2019-08-07 ENCOUNTER — Encounter: Payer: Self-pay | Admitting: Advanced Practice Midwife

## 2019-08-14 ENCOUNTER — Encounter: Payer: Self-pay | Admitting: Advanced Practice Midwife

## 2019-08-14 DIAGNOSIS — Z3480 Encounter for supervision of other normal pregnancy, unspecified trimester: Secondary | ICD-10-CM

## 2019-08-14 DIAGNOSIS — D563 Thalassemia minor: Secondary | ICD-10-CM | POA: Insufficient documentation

## 2019-08-18 ENCOUNTER — Encounter: Payer: Self-pay | Admitting: Advanced Practice Midwife

## 2019-09-12 ENCOUNTER — Telehealth (INDEPENDENT_AMBULATORY_CARE_PROVIDER_SITE_OTHER): Payer: Medicaid Other | Admitting: Advanced Practice Midwife

## 2019-09-12 DIAGNOSIS — Z3A18 18 weeks gestation of pregnancy: Secondary | ICD-10-CM

## 2019-09-12 DIAGNOSIS — R0602 Shortness of breath: Secondary | ICD-10-CM

## 2019-09-12 DIAGNOSIS — O26892 Other specified pregnancy related conditions, second trimester: Secondary | ICD-10-CM

## 2019-09-12 DIAGNOSIS — Z3480 Encounter for supervision of other normal pregnancy, unspecified trimester: Secondary | ICD-10-CM

## 2019-09-12 NOTE — Progress Notes (Signed)
Virtual ROB  EZ:[id number] and SOB

## 2019-09-12 NOTE — Progress Notes (Signed)
   OBSTETRICS PRENATAL VIRTUAL VISIT ENCOUNTER NOTE  Provider location: Center for [facility] [place]   I connected with Miranda Harrison on 09/12/19 at  3:00 PM EDT by MyChart Video Encounter at home and verified that I am speaking with the correct person using two identifiers.   I discussed the limitations, risks, security and privacy concerns of performing an evaluation and management service virtually and the availability of in person appointments. I also discussed with the patient that there may be a patient responsible charge related to this service. The patient expressed understanding and agreed to proceed. Subjective:  Miranda Harrison is a 24 y.o. G2P1001 at [redacted]w[redacted]d being seen today for ongoing prenatal care.  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  Patient reports shortness of breath with activity.  Contractions: Not present. Vag. Bleeding: None.  Movement: Present. Denies any leaking of fluid.   The following portions of the patient's history were reviewed and updated as appropriate: allergies, current medications, past family history, past medical history, past social history, past surgical history and problem list.   Objective:  There were no vitals filed for this visit.  Fetal Status:     Movement: Present     General:  Alert, oriented and cooperative. Patient is in no acute distress.  Respiratory: Normal respiratory effort, no problems with respiration noted  Mental Status: Normal mood and affect. Normal behavior. Normal judgment and thought content.  Rest of physical exam deferred due to type of encounter  Imaging: No results found.  Assessment and Plan:  Pregnancy: G2P1001 at [redacted]w[redacted]d 1. Supervision of  other normal pregnancy, antepartum --Pt reports fetal movement, denies cramping, LOF, or vaginal bleeding --Anticipatory guidance about next visits/weeks of pregnancy given. --Pt asked questions about COVID vaccine in pregnancy. Discussed safety of vaccine and risks of COVID with pt.  Sent info from [employer] about COVID vaccine in pregnancy via MyChart. --Next visit in 3 weeks in office  - AFP, Serum, Open Spina Bifida; Future  2. Shortness of breath due to pregnancy in second trimester --SOB resolves with rest and is not associated with other URI symptoms or chest pain.   --Reviewed reasons to seek care, both emergency care and call for follow up in the office.  Preterm labor symptoms and general obstetric precautions including but not limited to vaginal bleeding, contractions, leaking of fluid and fetal movement were reviewed in detail with the patient. I discussed the assessment and treatment plan with the patient. The patient was provided an opportunity to ask questions and all were answered. The patient agreed with the plan and demonstrated an understanding of the instructions. The patient was advised to call back or seek an in-person office evaluation/go to MAU at [facility] for any urgent or concerning symptoms. Please refer to After Visit Summary for other counseling recommendations.   I provided 10 minutes of face-to-face time during this encounter.  Return in about 3 weeks (around 10/03/2019).  Future Appointments  Date Time Provider Department Center  09/20/2019  2:45 PM [facility] US[facility]    Sharen Counter, CNM Center for [employer], [facility]

## 2019-09-13 ENCOUNTER — Other Ambulatory Visit (HOSPITAL_COMMUNITY): Payer: Medicaid Other

## 2019-09-20 ENCOUNTER — Other Ambulatory Visit (HOSPITAL_COMMUNITY): Payer: Medicaid Other

## 2019-09-20 ENCOUNTER — Other Ambulatory Visit: Payer: Self-pay

## 2019-09-20 ENCOUNTER — Ambulatory Visit (HOSPITAL_COMMUNITY): Payer: Medicaid Other | Attending: Advanced Practice Midwife

## 2019-09-20 DIAGNOSIS — Z148 Genetic carrier of other disease: Secondary | ICD-10-CM

## 2019-09-20 DIAGNOSIS — Z3480 Encounter for supervision of other normal pregnancy, unspecified trimester: Secondary | ICD-10-CM

## 2019-09-20 DIAGNOSIS — Z3A20 20 weeks gestation of pregnancy: Secondary | ICD-10-CM | POA: Diagnosis not present

## 2019-09-20 DIAGNOSIS — Z363 Encounter for antenatal screening for malformations: Secondary | ICD-10-CM | POA: Diagnosis not present

## 2019-10-02 ENCOUNTER — Emergency Department (HOSPITAL_BASED_OUTPATIENT_CLINIC_OR_DEPARTMENT_OTHER): Payer: Medicaid Other

## 2019-10-02 ENCOUNTER — Other Ambulatory Visit: Payer: Self-pay

## 2019-10-02 ENCOUNTER — Emergency Department (HOSPITAL_COMMUNITY)
Admission: EM | Admit: 2019-10-02 | Discharge: 2019-10-03 | Disposition: A | Discharge status: Home / Self Care | Payer: Medicaid Other | Attending: Emergency Medicine | Admitting: Emergency Medicine

## 2019-10-02 ENCOUNTER — Ambulatory Visit (INDEPENDENT_AMBULATORY_CARE_PROVIDER_SITE_OTHER): Payer: Medicaid Other | Admitting: Advanced Practice Midwife

## 2019-10-02 ENCOUNTER — Encounter (HOSPITAL_COMMUNITY): Payer: Self-pay | Admitting: Emergency Medicine

## 2019-10-02 VITALS — BP 96/63 | HR 98 | Wt 126.0 lb

## 2019-10-02 DIAGNOSIS — R0602 Shortness of breath: Secondary | ICD-10-CM | POA: Diagnosis not present

## 2019-10-02 DIAGNOSIS — Z3480 Encounter for supervision of other normal pregnancy, unspecified trimester: Secondary | ICD-10-CM

## 2019-10-02 DIAGNOSIS — Z3A21 21 weeks gestation of pregnancy: Secondary | ICD-10-CM

## 2019-10-02 DIAGNOSIS — Z5321 Procedure and treatment not carried out due to patient leaving prior to being seen by health care provider: Secondary | ICD-10-CM | POA: Diagnosis not present

## 2019-10-02 DIAGNOSIS — O26892 Other specified pregnancy related conditions, second trimester: Secondary | ICD-10-CM

## 2019-10-02 LAB — CBC
HCT: 31.6 % — ABNORMAL LOW (ref 36.0–46.0)
Hemoglobin: 9.9 g/dL — ABNORMAL LOW (ref 12.0–15.0)
MCH: 27.8 pg (ref 26.0–34.0)
MCHC: 31.3 g/dL (ref 30.0–36.0)
MCV: 88.8 fL (ref 80.0–100.0)
Platelets: 236 10*3/uL (ref 150–400)
RBC: 3.56 MIL/uL — ABNORMAL LOW (ref 3.87–5.11)
RDW: 13.2 % (ref 11.5–15.5)
WBC: 8 10*3/uL (ref 4.0–10.5)
nRBC: 0 % (ref 0.0–0.2)

## 2019-10-02 LAB — BASIC METABOLIC PANEL
Anion gap: 9 (ref 5–15)
BUN: 11 mg/dL (ref 6–20)
CO2: 21 mmol/L — ABNORMAL LOW (ref 22–32)
Calcium: 8.5 mg/dL — ABNORMAL LOW (ref 8.9–10.3)
Chloride: 107 mmol/L (ref 98–111)
Creatinine, Ser: 0.61 mg/dL (ref 0.44–1.00)
GFR calc Af Amer: >60 mL/min (ref >60)
GFR calc non Af Amer: >60 mL/min (ref >60)
Glucose, Bld: 101 mg/dL — ABNORMAL HIGH (ref 70–99)
Potassium: 3.6 mmol/L (ref 3.5–5.1)
Sodium: 137 mmol/L (ref 135–145)

## 2019-10-02 LAB — TROPONIN I (HIGH SENSITIVITY)
Troponin I (High Sensitivity): <2 ng/L (ref <18)
Troponin I (High Sensitivity): <2 ng/L (ref <18)

## 2019-10-02 NOTE — ED Notes (Signed)
Pt stating that she has work in the morning and is not able to wait any longer. Pt encouraged to stay, but pt stated that she really feels okay.

## 2019-10-02 NOTE — Progress Notes (Signed)
   PRENATAL VISIT NOTE  Subjective:  Miranda Harrison is a 24 y.o. G2P1001 at [redacted]w[redacted]d being seen today for ongoing prenatal care.  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  Patient reports shortness of breath.  Contractions: Not present. Vag. Bleeding: None.  Movement: Present. Denies leaking of fluid.   The following portions of the patient's history were reviewed and updated as appropriate: allergies, current medications, past family history, past medical history, past social history, past surgical history and problem list.   Objective:   Vitals:   10/02/19 1544  BP: 96/63  Pulse: 98  Weight: 126 lb (57.2 kg)    Fetal Status: Fetal Heart Rate (bpm): 155   Movement: Present     General:  Alert, oriented and cooperative. Patient is in no acute distress.  Skin: Skin is warm and dry. No rash noted.   Cardiovascular: Normal heart rate noted  Respiratory: Normal respiratory effort, no problems with respiration noted  Abdomen: Soft, gravid, appropriate for gestational age.  Pain/Pressure: Absent     Pelvic: Cervical exam deferred        Extremities: Normal range of motion.     Mental Status: Normal mood and affect. Normal behavior. Normal judgment and thought content.   Assessment and Plan:  Pregnancy: G2P1001 at [redacted]w[redacted]d 1. Supervision of other normal pregnancy, antepartum --Anticipatory guidance about next visits/weeks of pregnancy given. --Next visit in 2 weeks in office for follow up on symptoms  2. Shortness of breath due to pregnancy in second trimester --Pt with significant shortness of breath.  Was with activity but now at rest as well. Feels like she cannot catch her breath and it takes several tries to get enough air.     --No hx asthma, no recent  URI or hx of COVID, no cardiac hx --SOB was discussed at her last prenatal visit 3 weeks ago but is now worsening, appears to be more than physiologic changes of pregnancy.   --Consult Dr Macon Large and pt sent to [facility] for further evaluation.  Preterm labor symptoms and general obstetric precautions including but not limited to vaginal bleeding, contractions, leaking of fluid and fetal movement were reviewed in detail with the patient. Please refer to After Visit Summary for other counseling recommendations.   No follow-ups on file.  No future appointments.  Sharen Counter, CNM

## 2019-10-02 NOTE — ED Provider Notes (Signed)
I was asked by triage staff to evaluate patient to see if appropriate for transfer to MAU.  Chief Complaint: Shortness of breath  HPI: Patient reports that she is [redacted] weeks pregnant.  She reports that she has been feeling intermittently short of breath since she began her second trimester around 7 weeks ago.  Patient feels that this is because her baby is in "breech" which was diagnosed on an ultrasound a few weeks ago.  She feels that the baby is pushing up on her diaphragm causing her to feel short of breath.  She reports that shortness of breath occurs randomly over the last 7 weeks and normally resolves within a few minutes.  Patient reports that she had the same problem with her prior pregnancy but it did not start until she was in her third trimester.  ROS: + SOB           -Fever, chills, cough, hemoptysis, abdominal pain, vaginal bleeding/discharge, extremity swelling/color change or any additional concerns.  Physical Exam:   Gen: No distress  Neuro: Awake and Alert  Skin: Warm    Focused Exam: Heart regular rate and rhythm, lungs clear bilaterally, abdomen nontender.  Respiratory rate regular and unlabored. - 6:28 PM: I discussed the case with MAU provider Melanie, advises they are aware of the patient but recommended that she be evaluated in the emergency department for her shortness of breath. Not appropriate for MAU given CC. - Discussed case with Dr. Clarene Duke, will obtain CBC, BMP, troponin, chest x-ray, EKG and DVT study.  Given history and physical examination doubt pulmonary embolism at this time.  Initiation of care has begun. The patient has been counseled on the process, plan, and necessity for staying for the completion/evaluation, and the remainder of the medical screening examination    Elizabeth Palau 10/02/19 1857    Little, Ambrose Finland, MD 10/02/19 2350

## 2019-10-02 NOTE — Progress Notes (Signed)
Pt states she is having difficulty breathing.

## 2019-10-02 NOTE — Progress Notes (Signed)
Lower extremity venous has been completed.   Preliminary results in CV Proc.   Blanch Media 10/02/2019 6:54 PM

## 2019-10-02 NOTE — ED Triage Notes (Signed)
Pt c/o constant shortness of breath since "the second trimester". [redacted] weeks pregnant.

## 2019-10-16 ENCOUNTER — Other Ambulatory Visit: Payer: Self-pay

## 2019-10-16 ENCOUNTER — Ambulatory Visit (INDEPENDENT_AMBULATORY_CARE_PROVIDER_SITE_OTHER): Payer: Medicaid Other | Admitting: Advanced Practice Midwife

## 2019-10-16 VITALS — BP 106/67 | HR 102 | Wt 129.0 lb

## 2019-10-16 DIAGNOSIS — R0602 Shortness of breath: Secondary | ICD-10-CM | POA: Diagnosis not present

## 2019-10-16 DIAGNOSIS — Z3480 Encounter for supervision of other normal pregnancy, unspecified trimester: Secondary | ICD-10-CM

## 2019-10-16 DIAGNOSIS — O26892 Other specified pregnancy related conditions, second trimester: Secondary | ICD-10-CM | POA: Diagnosis not present

## 2019-10-16 DIAGNOSIS — Z3A23 23 weeks gestation of pregnancy: Secondary | ICD-10-CM | POA: Diagnosis not present

## 2019-10-16 DIAGNOSIS — O26891 Other specified pregnancy related conditions, first trimester: Secondary | ICD-10-CM

## 2019-10-16 DIAGNOSIS — R109 Unspecified abdominal pain: Secondary | ICD-10-CM

## 2019-10-16 NOTE — Progress Notes (Signed)
° °  PRENATAL VISIT NOTE  Subjective:  Miranda Harrison is a 24 y.o. G2P1001 at [redacted]w[redacted]d being seen today for ongoing prenatal care.  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  Patient reports intermittent shortness of breath and occasional abdominal/pelvic pressure.  Contractions: Not present. Vag. Bleeding: None.  Movement: Present. Denies leaking of fluid.   The following portions of the patient's history were reviewed and updated as appropriate: allergies, current medications, past family history, past medical history, past social history, past surgical history and problem list.   Objective:   Vitals:   10/16/19 1442  BP: 106/67  Pulse: (!) 102  Weight: 129 lb (58.5 kg)    Fetal Status: Fetal Heart Rate (bpm): 158   Movement: Present     General:  Alert, oriented and cooperative. Patient is in no acute distress.  Skin: Skin is warm and dry. No rash noted.   Cardiovascular: Normal heart rate noted  Respiratory: Normal respiratory effort, no problems with respiration noted  Abdomen: Soft, gravid, appropriate for gestational age.  Pain/Pressure: Present     Pelvic: Cervical exam deferred        Extremities: Normal range of motion.  Edema: None  Mental Status: Normal mood and affect. Normal behavior. Normal judgment and thought content.   Assessment and Plan:  Pregnancy: G2P1001 at [redacted]w[redacted]d 1. Supervision of other normal pregnancy, antepartum --Anticipatory guidance about next visits/weeks of pregnancy given. --next visit in 4 weeks in office for GTT  2. Shortness of breath due to pregnancy in second trimester --Improved since last visit but still present.  Pt had eval in ED 10/02/19 with normal EKG, labs, venous doppler. She left  prior to CT but no acute symptoms or other findings c/w PE.  Warning signs reviewed with pt, reasons to seek medical care. --Fetal position confirmed vertex by [place] today, pt concerned that breech position was contributing to her SOB.   3. Abdominal pain during pregnancy in first trimester --Occasional, improved with positioning/rest.   Preterm labor symptoms and general obstetric precautions including but not limited to vaginal bleeding, contractions, leaking of fluid and fetal movement were reviewed in detail with the patient. Please refer to After Visit Summary for other counseling recommendations.   No follow-ups on file.  Future Appointments  Date Time Provider Department Center  10/16/2019  3:00 PM Leftwich-Kirby, Wilmer Floor, CNM [facility] None    Sharen Counter, CNM

## 2019-10-16 NOTE — Progress Notes (Signed)
Patient reports fetal movement with some pressure. 

## 2019-10-16 NOTE — Patient Instructions (Addendum)

## 2019-11-13 ENCOUNTER — Other Ambulatory Visit: Payer: Medicaid Other

## 2019-11-13 ENCOUNTER — Encounter: Payer: Medicaid Other | Admitting: Nurse Practitioner

## 2021-07-27 IMAGING — US US OB COMP LESS 14 WK
1 series · 15 of 22 positions shown · non-contrast
Comparison: None.

CLINICAL DATA: Abdominal pain

EXAM:
OBSTETRIC <14 WK ULTRASOUND
TECHNIQUE: Transabdominal ultrasound was performed for evaluation of the
gestation as well as the maternal uterus and adnexal regions.

[Series 1: us ob comp less 14 wk · 22 acquisitions, 15 frames shown]
[im 1/22]
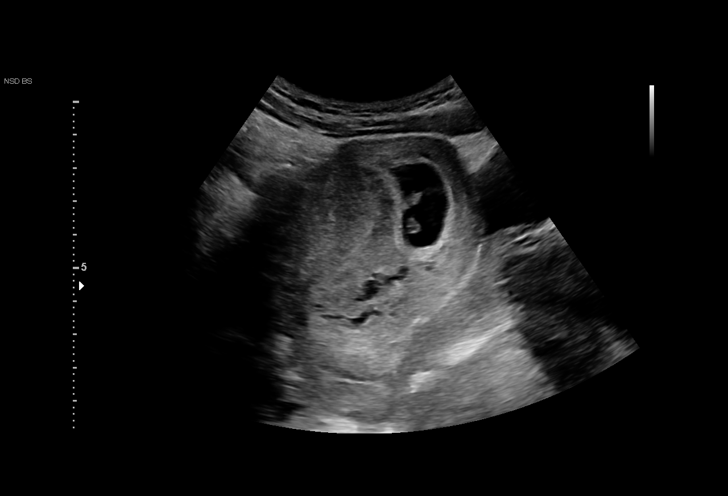
[im 3/22]
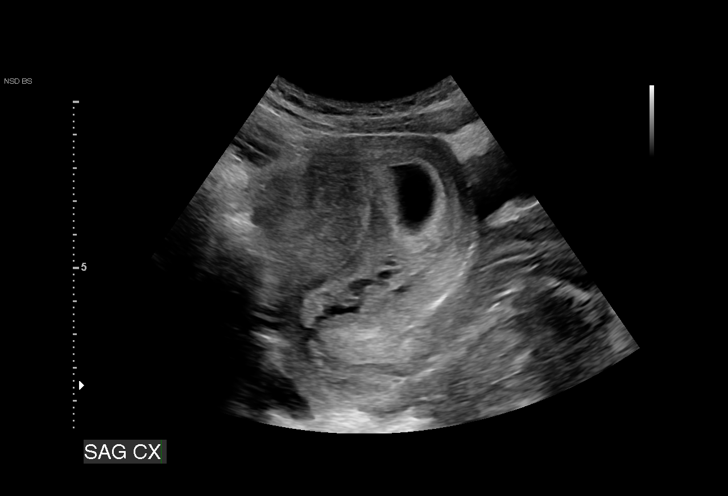
[im 4/22]
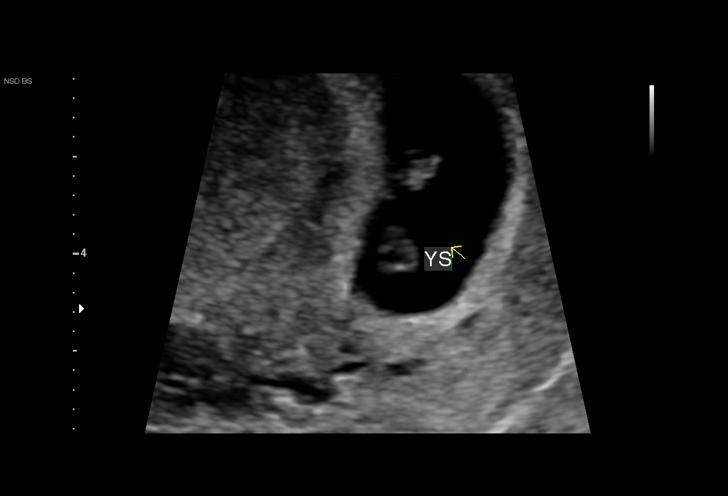
[im 6/22]
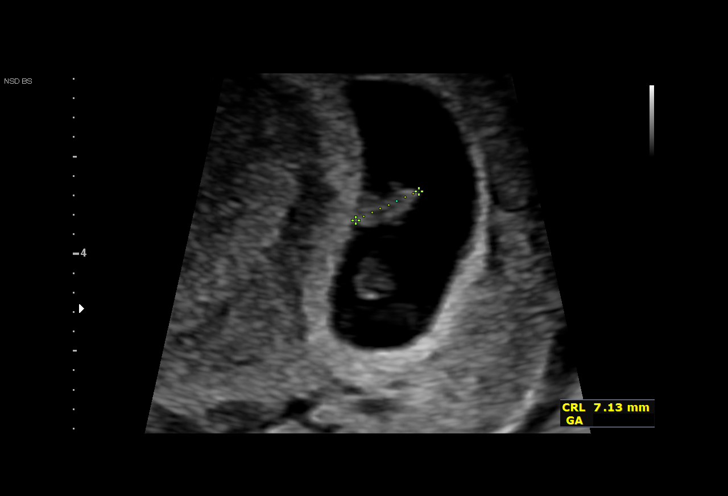
[im 7/22]
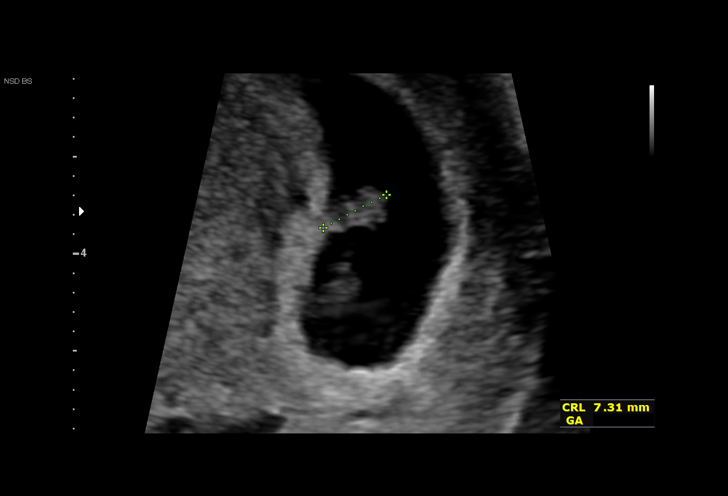
[im 9/22]
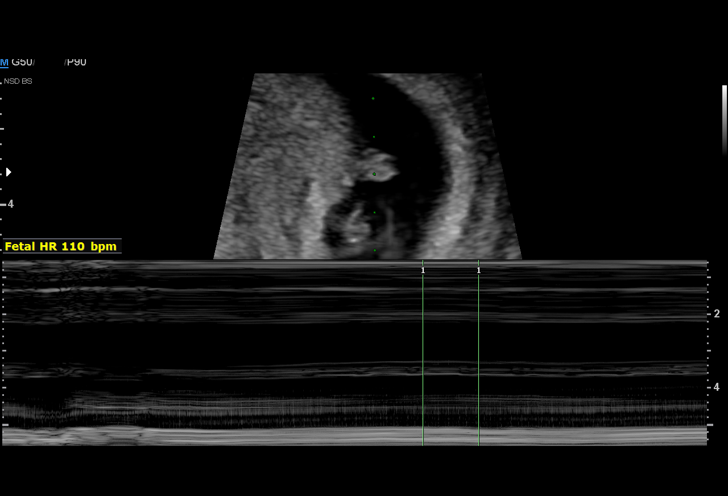
[im 10/22]
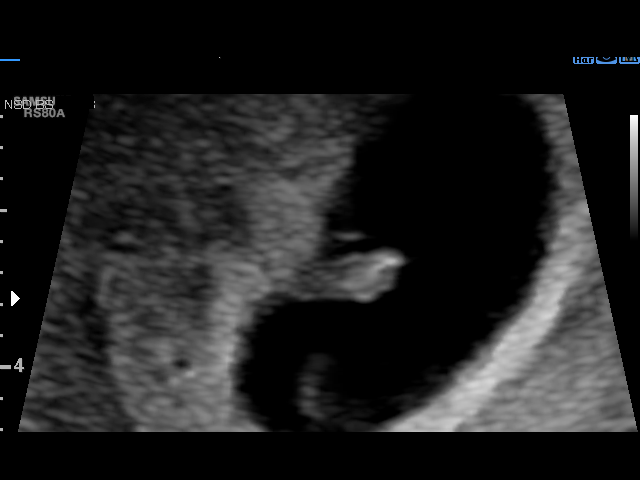
[im 12/22]
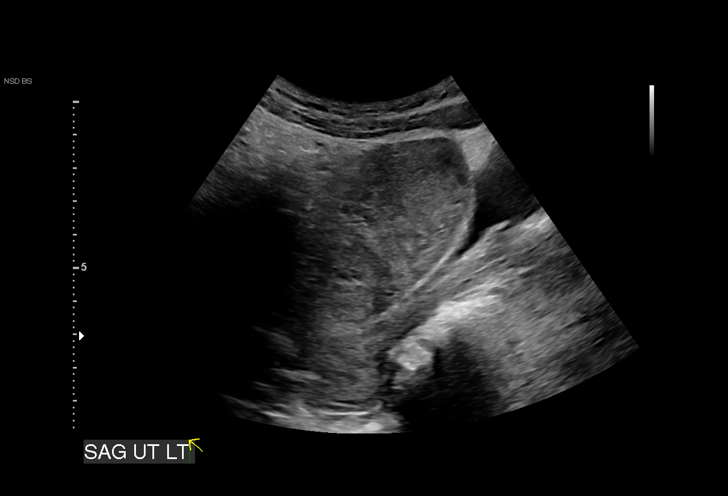
[im 13/22]
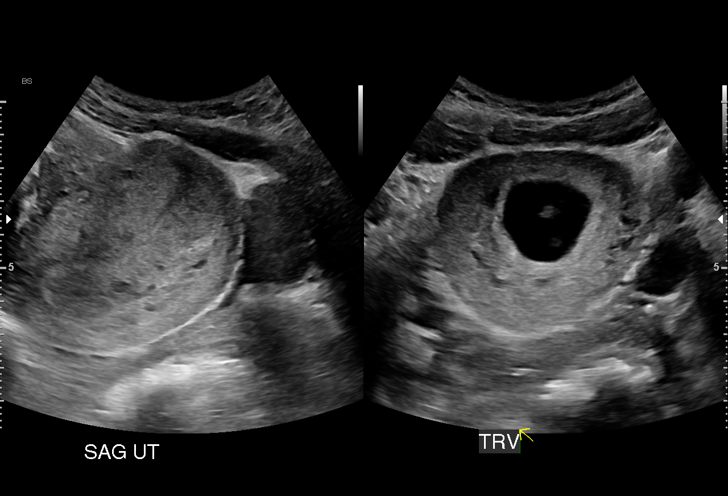
[im 14/22]
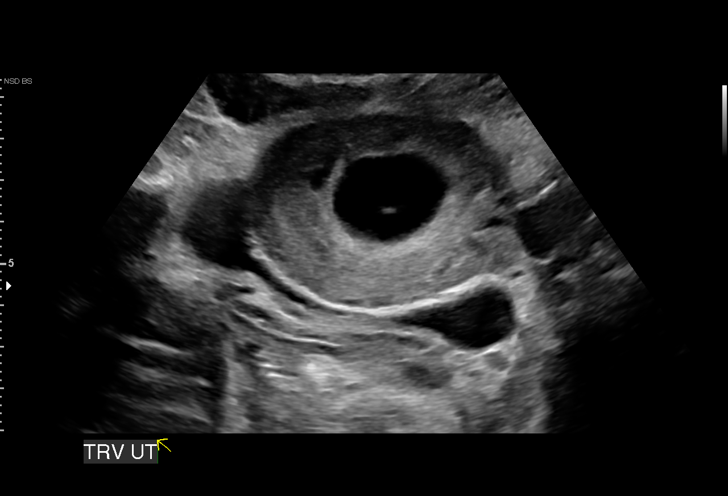
[im 16/22]
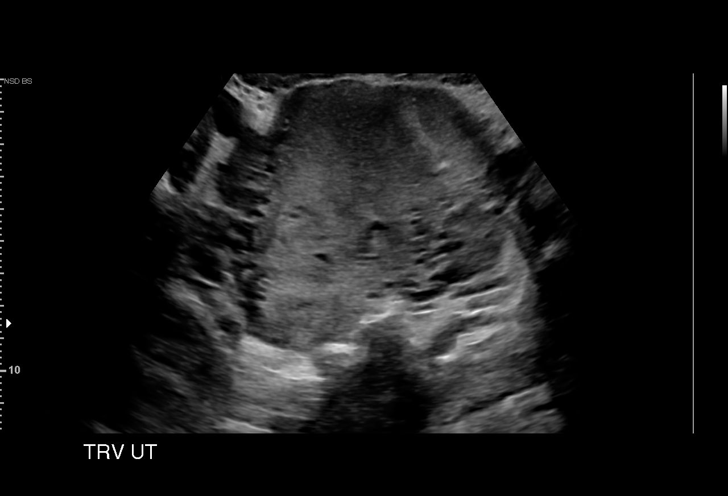
[im 17/22]
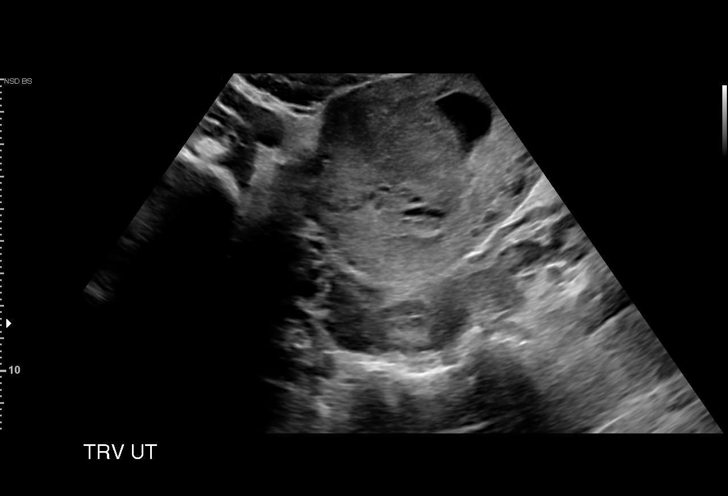
[im 19/22]
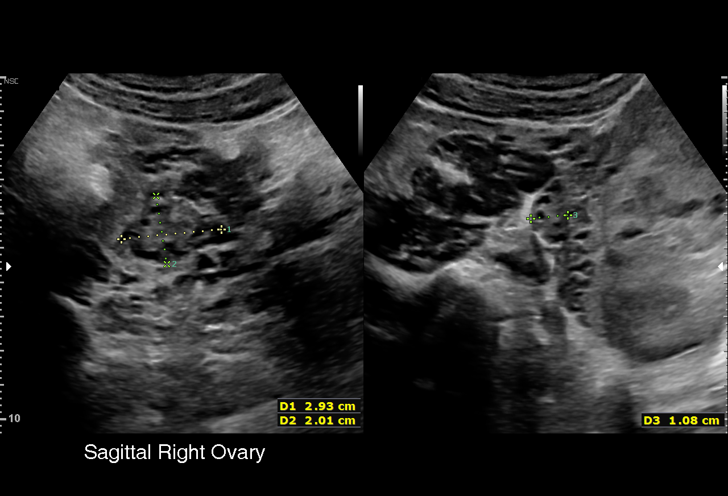
[im 20/22]
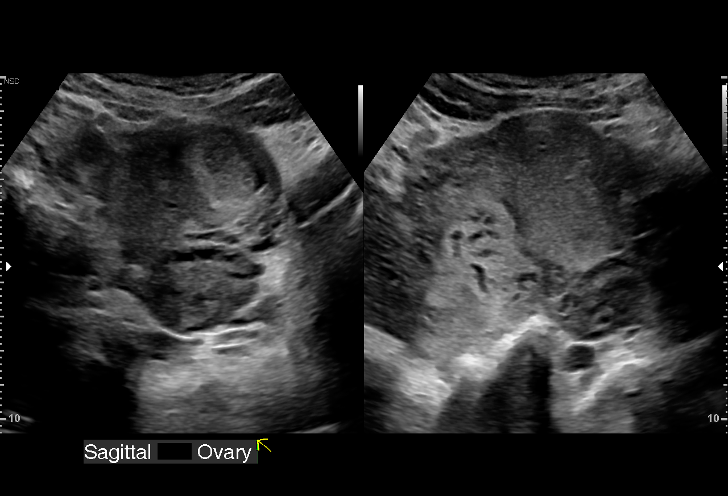
[im 22/22]
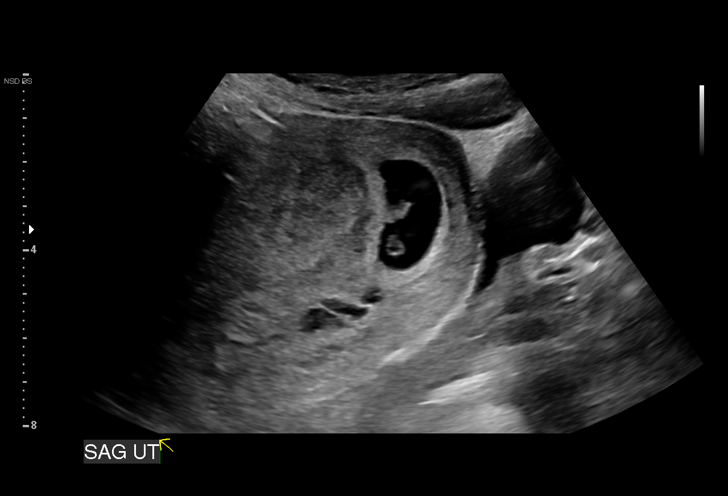

[15 of 22 positions shown; findings below may reference images not displayed]

FINDINGS: Intrauterine gestational sac: Single

Yolk sac:  Visualized.

Embryo:  Visualized.

Cardiac Activity: Visualized.

Heart Rate: 110 bpm

CRL:   7.4 mm   6 w 4 d                  US EDC: 02/07/2020

Subchorionic hemorrhage:  Small subchorionic hemorrhage.

Maternal uterus/adnexae: Normal appearing ovaries.
IMPRESSION: Single live intrauterine pregnancy measuring 6 weeks 4 days.

## 2022-06-19 ENCOUNTER — Ambulatory Visit (INDEPENDENT_AMBULATORY_CARE_PROVIDER_SITE_OTHER): Payer: BC Managed Care – PPO

## 2022-06-19 ENCOUNTER — Other Ambulatory Visit (HOSPITAL_COMMUNITY)
Admission: RE | Admit: 2022-06-19 | Discharge: 2022-06-19 | Disposition: A | Discharge status: Home / Self Care | Payer: BC Managed Care – PPO | Source: Ambulatory Visit | Attending: Obstetrics & Gynecology | Admitting: Obstetrics & Gynecology

## 2022-06-19 VITALS — BP 103/63 | HR 83 | Ht 59.0 in | Wt 121.0 lb

## 2022-06-19 DIAGNOSIS — Z1331 Encounter for screening for depression: Secondary | ICD-10-CM

## 2022-06-19 DIAGNOSIS — Z01419 Encounter for gynecological examination (general) (routine) without abnormal findings: Secondary | ICD-10-CM

## 2022-06-19 DIAGNOSIS — Z1239 Encounter for other screening for malignant neoplasm of breast: Secondary | ICD-10-CM

## 2022-06-19 DIAGNOSIS — Z862 Personal history of diseases of the blood and blood-forming organs and certain disorders involving the immune mechanism: Secondary | ICD-10-CM

## 2022-06-19 DIAGNOSIS — Z113 Encounter for screening for infections with a predominantly sexual mode of transmission: Secondary | ICD-10-CM | POA: Insufficient documentation

## 2022-06-19 DIAGNOSIS — Z3046 Encounter for surveillance of implantable subdermal contraceptive: Secondary | ICD-10-CM

## 2022-06-19 NOTE — Progress Notes (Unsigned)
    GYNECOLOGY OFFICE VISIT NOTE-WELL WOMAN EXAM  History:   Miranda Harrison [id number] here today for annual exam. She currently reports that she has a Nexplanon in place and has had issues including bleeding, mood swings, decreased libido, and various other issues. She states the bleeding has been ongoing since placement. She states she bleeds about 3 weeks out of the month.  She endorses some cramping and passing of penny to half dollar sized clots. She denies any abnormal vaginal discharge, but endorses some pelvic pain that is occasional and sharp. She states it is random and occurs about 2x monthly.    Birth Control:  Nexplanon in place. Requests removal  Reproductive Concerns Partners Type: Female-Husband Number of partners in last year: One STD Testing: Desires Full  Vaginal/GU Concerns: She denies issues with urination and endorses issues with constipation.  Breast Concerns/Exams: No issues or concerns. She endorses breast exams 1-2x since birth of son in 2021. 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 No pertinent surgical history.  The following portions of the patient's history were reviewed and updated as appropriate: allergies, current medications, past family history, past medical history, past social history, past surgical history and problem list.   Health Maintenance:  Normal pap and negative HRHPV on April 2021.  No mammogram or breast US history.   Review of Systems:  Pertinent items noted in HPI and remainder of  comprehensive ROS otherwise negative.    Objective:    Physical Exam BP 103/63   Pulse 83   Ht '4\' 11"'$  (1.499 m)   Wt 121 lb (54.9 kg)   BMI 24.44 kg/m  Physical Exam   Labs and Imaging No results found for this or any previous visit (from the past 168 hour(s)). No results found.   Assessment & Plan:       1. Women's annual routine gynecological examination *** - Cytology - PAP - Ambulatory referral to [facility]  2. Screening breast examination ***  3. Positive screening for depression on 9-item Patient Health Questionnaire (PHQ-9) *** - Ambulatory referral to [facility]  4. Encounter for Nexplanon removal ***   Routine preventative health maintenance measures emphasized. Please refer to After Visit Summary for other counseling recommendations.   No follow-ups on file.      Maryann Conners, CNM 06/19/2022

## 2022-06-19 NOTE — Progress Notes (Signed)
Pt presents for annual exam and nexplanon removal. Pt reports having prolonged bleeding with the nexplanon and would like it removed. Denies any abnormal vaginal discharge, odor or pain. Denies any abnormal breast changes. PHQ & GAD positive. Pt would like [facility] referral. No other concerns at this time.

## 2022-06-19 NOTE — Patient Instructions (Signed)
[place] ([place] [address] [place], [place] [address] [phone] Mon-Fri 8:30-12:30, 1:30-5:00 Accepting [facility] Family Medicine at [place], [place], [place] [address] [phone] Mon-Fri 8:00-5:30 [facility] [address] [place], [place] [address] [phone] Mechele Dawley, Thur, Fri 8:30-5:00, Wed 10:00-7:00 (closed 1-2pm) Accepting [facility] [place]. [address], [place], [place]  [address] Phone - [phone]   Fax - [phone]  [place] [phone]) [facility] [address], [place], [place] [address] [phone] Mon-Fri 8:00-5:00 [facility] at [facility])  [address] Barbara Cower [place], [place] [address] [phone] Mon-Fri 8:30-5:30, Sat (Oct.-Mar.) 9:00-1:00 Accepting Medicaid  [place] [phone]) [place] at [place], [place], [place] [address] [phone] Mon-Fri 8:00-5:00  [place] [phone]) [place] at [facility] [address] [place], [place] [address] [phone] Mon-Fri 8:00-5:00 [place] at [place], [place], [place] [address] [phone] Mon-Fri 8:00-5:00 [place] at [facility] [place] [address] Madelaine Bhat [place], [place] [address] [phone] Mon-Fri 8:00-5:00 [place]., [place] [address] [phone] Mon-Fri 7:30-5:30  [place] [phone] & [phone]) [facility] [address], [place], [place] [address] [phone] Mon-Thur 8:00-6:00 Accepting Medicaid [place] [address] Madelaine Bhat [place], [place] [address] [phone] Mon-Thur 7:30-7:30, Fri 7:30-4:30 Accepting  [facility] at [place]. [address], [place], [place]  [address] [phone]   Fax - [place] [phone] & [phone]) [place] at [place] [place], [place] [address] [phone] Mon-Fri 7:00-5:00 [place] [address], [place], [place] [address] [phone] Mon-Fri 8:00-5:00 Accepting Medicaid [place] [address] [place], [place], [place] [address] [phone] Mon-Fri 8:00-5:00 Accepting Medicaid  [place] [phone]) [facility] at [facility] [address] Madelaine Bhat [place], [place] [address] [phone] Mon-Fri 8:00-5:00 [place] ([place] at [facility]) [address] [place], [place], [place] [address] [phone] Mon-Fri 8:00-5:00 Accepting Medicaid [place] ([facility] [employer]) [address] [place], [place], [place] [address] [phone] Mon-Fri 8:00-5:30, Sat&Sun by appointment (phones open at 8:30) Accepting [facility] [phone] & [phone]) [place] [address], [place], [place] [address] [phone] Mon-Thur 8:00-7:00, Fri 8:00-5:00, Sat 8:00-12:00, Sun 9:00-12:00 Accepting Medicaid [facility] at [facility] [address] [place], [place], [place] [address] [phone] Mon-Thur 8:00-5:00 Accepting Medicaid [facility] at [place]., [place], [place] [address] [phone] Mon-Fri 8:00-5:30, Sat (Oct.-Mar.) 9:00-1:00 Accepting [facility]  [place] [phone]) [address] [place], [place], [place] [address] [phone] Mon-Fri 8:00-5:00 Accepting [facility]   [place] [phone]) [place] at [facility] [address], [place], [place] [address] [phone] Mon-Fri 8:00-5:00 [place] at [facility] [address] Marolyn Hammock [place], [place] [address] [phone] Mon-Fri 8:00-5:00 [place], [place], [place] [address] [phone] Mon-Fri 8:00-5:00 After hours clinic [facility] [place],  [address]) [phone] Mon-Fri 5:00-8:00, Sat 12:00-6:00, Sun 10:00-4:00 Accepting Medicaid [place] at [facility]  [address], [place], [place]  [address] [phone]   Fax - [phone]  [place] [phone]) [place] at [facility] [address] [place], [place], [place] [address] [phone] Mon-Fri 8:00-5:00 [place] ([place] at [place]) [address] [place], [place], [place] [address] [phone] Mon-Thur 8:00-7:00, Fri 8:00-5:00, Sat 8:00-12:00

## 2022-06-21 LAB — HEMOGLOBIN AND HEMATOCRIT, BLOOD
Hematocrit: 35.3 % (ref 34.0–46.6)
Hemoglobin: 11.3 g/dL (ref 11.1–15.9)

## 2022-06-21 LAB — HEPB+HEPC+HIV PANEL
HIV Screen 4th Generation wRfx: NONREACTIVE
Hep B C IgM: NEGATIVE
Hep B Core Total Ab: NEGATIVE
Hep B E Ab: NEGATIVE
Hep B E Ag: NEGATIVE
Hep B Surface Ab, Qual: NONREACTIVE
Hep C Virus Ab: NONREACTIVE
Hepatitis B Surface Ag: NEGATIVE

## 2022-06-22 LAB — CERVICOVAGINAL ANCILLARY ONLY
Chlamydia: NEGATIVE
Comment: NEGATIVE
Comment: NEGATIVE
Comment: NORMAL
Neisseria Gonorrhea: NEGATIVE
Trichomonas: NEGATIVE

## 2022-06-23 LAB — CYTOLOGY - PAP: Diagnosis: NEGATIVE

## 2022-06-25 ENCOUNTER — Ambulatory Visit (INDEPENDENT_AMBULATORY_CARE_PROVIDER_SITE_OTHER): Payer: Medicaid Other | Admitting: Licensed Clinical Social Worker

## 2022-06-25 DIAGNOSIS — F320 Major depressive disorder, single episode, mild: Secondary | ICD-10-CM

## 2022-06-29 NOTE — BH Specialist Note (Signed)
Integrated Behavioral Health via Telemedicine Visit  06/29/2022 Miranda Harrison [id number]  Number of [place] Clinician visits: 1 Session Start time:  4:00pm Session End time: 4:50pm Total time in minutes: 50 mins via mychart video   Referring Provider: Milinda Cave CNM Patient/Family location: [place]  [facility] Provider location: [place]  All persons participating in visit: Miranda Harrison and LCSW A Kezia Benevides  Types of Service: Individual psychotherapy and Video visit  I connected with Miranda Harrison and/or Miranda Harrison n/a via  Telephone or Geologist, engineering  (Video is Caregility application) and verified that I am speaking with the correct person using two identifiers. Discussed confidentiality: Yes   I discussed the limitations of telemedicine and the availability of in person appointments.  Discussed there is a possibility of technology failure and discussed alternative modes of communication if that failure occurs.  I discussed that engaging in this telemedicine visit, they consent to the provision of behavioral healthcare and the services will be billed under their insurance.  Patient and/or legal guardian expressed understanding and consented to Telemedicine visit: Yes   Presenting Concerns: Patient and/or family reports the following symptoms/concerns: elevated phq9, low motivation, low mood and feeling overwhelmed Duration of problem: over one year ; Severity of problem: mild  Patient and/or Family's Strengths/Protective Factors: Concrete supports in place (healthy food, safe environments, etc.)  Goals Addressed: Patient will:  Reduce symptoms of: depression   Increase knowledge and/or ability of: coping skills   Demonstrate ability to: Increase healthy adjustment to current life circumstances  Progress towards Goals: Ongoing  Interventions: Interventions utilized:  Motivational Interviewing and Supportive Counseling Standardized  Assessments completed: PHQ 9  Patient and/or Family Response: Miranda Harrison reports low mood, low motivation and feeling overwhelmed. Miranda Harrison reports feeling uncertain regarding sense of identity.   Assessment: Patient currently experiencing mild depressive episode .   Patient may benefit from integrated behavioral health.  Plan: Follow up with behavioral health clinician on : 3 weeks via mychart  Behavioral recommendations: Engage in new hobbies, self care, continue with video or writing journal, and communicate needs with spouse Referral(s): [place] (In Clinic)  I discussed the assessment and treatment plan with the patient and/or parent/guardian. They were provided an opportunity to ask questions and all were answered. They agreed with the plan and demonstrated an understanding of the instructions.   They were advised to call back or seek an in-person evaluation if the symptoms worsen or if the condition fails to improve as anticipated.  Lynnea Ferrier, LCSW  weeks

## 2022-07-03 ENCOUNTER — Ambulatory Visit: Payer: Medicaid Other | Admitting: Obstetrics & Gynecology

## 2022-07-08 ENCOUNTER — Encounter: Payer: BC Managed Care – PPO | Admitting: Licensed Clinical Social Worker

## 2022-07-08 ENCOUNTER — Telehealth: Payer: Self-pay | Admitting: Licensed Clinical Social Worker

## 2022-07-08 NOTE — Telephone Encounter (Signed)
Called pt regarding scheduled mychart appt. Left message requesting callback

## 2022-07-16 ENCOUNTER — Encounter: Payer: BC Managed Care – PPO | Admitting: Licensed Clinical Social Worker

## 2022-09-09 ENCOUNTER — Ambulatory Visit
Admission: RE | Admit: 2022-09-09 | Discharge: 2022-09-09 | Disposition: A | Discharge status: Home / Self Care | Payer: BC Managed Care – PPO | Source: Ambulatory Visit | Attending: Nurse Practitioner | Admitting: Nurse Practitioner

## 2022-09-09 VITALS — BP 96/65 | HR 92 | Temp 99.1°F | Resp 16

## 2022-09-09 DIAGNOSIS — Z202 Contact with and (suspected) exposure to infections with a predominantly sexual mode of transmission: Secondary | ICD-10-CM | POA: Insufficient documentation

## 2022-09-09 DIAGNOSIS — Z113 Encounter for screening for infections with a predominantly sexual mode of transmission: Secondary | ICD-10-CM | POA: Insufficient documentation

## 2022-09-09 DIAGNOSIS — N760 Acute vaginitis: Secondary | ICD-10-CM | POA: Insufficient documentation

## 2022-09-09 DIAGNOSIS — B9689 Other specified bacterial agents as the cause of diseases classified elsewhere: Secondary | ICD-10-CM | POA: Diagnosis not present

## 2022-09-09 NOTE — ED Provider Notes (Signed)
[facility]    CSN: [id number] Arrival date & time: 09/09/22  1720      History   Chief Complaint Chief Complaint  Patient presents with   Exposure to STD    Routine sti/std check up - Entered by patient    HPI Miranda Harrison is a 27 y.o. female presents for evaluation of STD screening.  She denies any symptoms including vaginal discharge, dysuria, fevers, chills, nausea/vomiting, flank pain.  No exposure.  Reports she likes to get routine screening.  No other concerns at this time.   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 No pertinent surgical history.  OB History     Gravida  2   Para  1   Term  1   Preterm      AB      Living  2      SAB      IAB      Ectopic      Multiple      Live Births  2            Home Medications    Prior to Admission medications   Medication Sig Start Date End Date Taking? Authorizing Provider  Blood Pressure Monitor KIT 1 Device by Does not apply route once a week. To be monitored Regularly at home. Patient not taking: Reported on 06/19/2022 07/25/19   Leftwich-Kirby, Wilmer Floor, CNM  Doxylamine-Pyridoxine 10-10 MG TBEC Two tablets at bedtime on day 1 and 2; if symptoms persist, take 1 tablet in morning and 2 tablets at bedtime on day 3; if symptoms persist, may increase to 1 tablet in morning, 1 tablet mid-afternoon, and 2 tablets at bedtime on day 4 (maximum: doxylamine 40 mg/pyridoxine 40 mg (4 tablets) per day). Patient not taking: Reported on 10/16/2019 06/15/19   Belinda Fisher, PA-C  Prenatal Vit-Fe Fumarate-FA (PRENATAL VITAMIN PO) Take by  mouth. Patient not taking: Reported on 06/19/2022    [provider]  promethazine (PHENERGAN) 25 MG tablet Take 1 tablet (25 mg total) by mouth every 6 (six) hours as needed for nausea or vomiting. Patient not taking: Reported on 07/25/2019 06/25/19   Calvert Cantor, CNM  terconazole (TERAZOL 7) 0.4 % vaginal cream Place 1 applicator vaginally at bedtime. Use for seven days Patient not taking: Reported on 07/25/2019 06/18/19   Judeth Horn, NP    Family History Family History  Problem Relation Age of Onset   Depression Mother    Ovarian cancer Mother    Anxiety disorder Maternal Aunt    Bipolar disorder Maternal Grandmother    Ovarian cancer Maternal Grandmother     Social History Social History   Tobacco Use   Smoking status: Never   Smokeless tobacco: Never  Vaping Use   Vaping Use: Never used  Substance Use Topics   Alcohol use: No   Drug use: Not Currently    Types: Marijuana    Comment: none since confirming +UPT      Allergies   Patient has no known allergies.   Review of Systems Review of Systems  Genitourinary:        STD screening     Physical Exam Triage Vital Signs ED Triage Vitals  Enc Vitals Group     BP 09/09/22 1737 96/65     Pulse Rate 09/09/22 1737 92     Resp 09/09/22 1737 16     Temp 09/09/22 1737 99.1 F (37.3 C)     Temp Source 09/09/22 1737 Oral     SpO2 09/09/22 1737 99 %     Weight --      Height --      Head Circumference --      Peak Flow --      Pain Score 09/09/22 1739 0     Pain Loc --      Pain Edu? --      Excl. in GC? --    No data found.  Updated Vital Signs BP 96/65 (BP Location: Left Arm)   Pulse 92   Temp 99.1 F (37.3 C) (Oral)   Resp 16   LMP 08/14/2022 (Exact Date)   SpO2 99%   Visual Acuity Right Eye Distance:   Left Eye Distance:   Bilateral Distance:    Right Eye Near:   Left Eye Near:    Bilateral Near:     Physical Exam Vitals and nursing note reviewed.  Constitutional:       General: She is not in acute distress.    Appearance: Normal appearance. She is not ill-appearing.  HENT:     Head: Normocephalic and atraumatic.  Eyes:     Pupils: Pupils are equal, round, and reactive to light.  Cardiovascular:     Rate and Rhythm: Normal rate.  Pulmonary:     Effort: Pulmonary effort is normal.  Skin:    General: Skin is warm and dry.  Neurological:     General: No focal deficit present.     Mental Status: She is alert and oriented to person, place, and time.  Psychiatric:        Mood and Affect: Mood normal.        Behavior: Behavior normal.      UC Treatments / Results  Labs (all labs ordered are listed, but only abnormal results are displayed) Labs Reviewed  CERVICOVAGINAL ANCILLARY ONLY    EKG   Radiology No results found.  Procedures Procedures (including critical care time)  Medications Ordered in UC Medications - No data to display  Initial Impression / Assessment and Plan / UC Course  I have reviewed the triage vital signs and the nursing notes.  Pertinent labs & imaging results that were available during my care of the patient were reviewed by me and considered in my medical decision making (see chart for details).     STD testing is ordered.  Patient declined blood work Will contact for any positive results Follow-up as needed Final Clinical Impressions(s) / UC Diagnoses   Final diagnoses:  Screening examination for STD (sexually transmitted disease)     Discharge Instructions      Declined AVS   ED Prescriptions   None    PDMP not reviewed this encounter.   Radford Pax, NP 09/09/22 [phone]

## 2022-09-09 NOTE — Discharge Instructions (Signed)
Declined AVS

## 2022-09-09 NOTE — ED Triage Notes (Signed)
Pt states she would like to have Sti screening. Denies symptoms.

## 2022-09-10 LAB — CERVICOVAGINAL ANCILLARY ONLY
Bacterial Vaginitis (gardnerella): POSITIVE — AB
Candida Glabrata: NEGATIVE
Candida Vaginitis: NEGATIVE
Chlamydia: NEGATIVE
Comment: NEGATIVE
Comment: NEGATIVE
Comment: NEGATIVE
Comment: NEGATIVE
Comment: NEGATIVE
Comment: NORMAL
Neisseria Gonorrhea: NEGATIVE
Trichomonas: NEGATIVE

## 2022-09-11 ENCOUNTER — Telehealth (HOSPITAL_COMMUNITY): Payer: Self-pay | Admitting: Emergency Medicine

## 2022-09-11 MED ORDER — METRONIDAZOLE 500 MG PO TABS: 500.0000 mg | ORAL_TABLET | Freq: Two times a day (BID) | ORAL | 0 refills | Status: AC

## 2022-09-17 ENCOUNTER — Ambulatory Visit
Admission: EM | Admit: 2022-09-17 | Discharge: 2022-09-17 | Disposition: A | Discharge status: Home / Self Care | Payer: BC Managed Care – PPO | Attending: Internal Medicine | Admitting: Internal Medicine

## 2022-09-17 DIAGNOSIS — R112 Nausea with vomiting, unspecified: Secondary | ICD-10-CM | POA: Diagnosis not present

## 2022-09-17 DIAGNOSIS — K529 Noninfective gastroenteritis and colitis, unspecified: Secondary | ICD-10-CM | POA: Diagnosis not present

## 2022-09-17 DIAGNOSIS — R197 Diarrhea, unspecified: Secondary | ICD-10-CM | POA: Diagnosis not present

## 2022-09-17 LAB — POCT URINALYSIS DIP (MANUAL ENTRY)
Bilirubin, UA: NEGATIVE
Glucose, UA: NEGATIVE mg/dL
Ketones, POC UA: NEGATIVE mg/dL
Nitrite, UA: NEGATIVE
Spec Grav, UA: 1.025 (ref 1.010–1.025)
Urobilinogen, UA: 0.2 E.U./dL
pH, UA: 6 (ref 5.0–8.0)

## 2022-09-17 MED ORDER — LOPERAMIDE HCL 2 MG PO CAPS: 2.0000 mg | ORAL_CAPSULE | Freq: Two times a day (BID) | ORAL | 0 refills | Status: AC | PRN

## 2022-09-17 MED ORDER — ACETAMINOPHEN 325 MG PO TABS: 650.0000 mg | ORAL_TABLET | Freq: Four times a day (QID) | ORAL | 0 refills | Status: AC | PRN

## 2022-09-17 MED ORDER — ONDANSETRON 8 MG PO TBDP: 8.0000 mg | ORAL_TABLET | Freq: Three times a day (TID) | ORAL | 0 refills | Status: AC | PRN

## 2022-09-17 NOTE — ED Provider Notes (Signed)
[facility]  Note:  This document was prepared using Conservation officer, historic buildings and may include unintentional dictation errors.  MRN: [id number] DOB: 08/27/1995  Subjective:   Miranda Harrison is a 27 y.o. female presenting for 3-day history of nausea, vomiting, diarrhea.  Patient just finished a course of metronidazole yesterday.  She did come back from [place] on 09/07/2022 but did not have any abnormal symptoms there.  No bloody stools.  No fever, recent antibiotic use, hospitalizations or long distance travel.  Has not eaten raw foods, drank unfiltered water.  No history of GI disorders including Crohn's, IBS, ulcerative colitis.   No current facility-administered medications for this encounter.  Current Outpatient Medications:    Blood Pressure Monitor KIT, 1 Device by Does not apply route once a week. To be monitored Regularly at home. (Patient not taking: Reported on 06/19/2022), Disp: 1 kit, Rfl: 0   Doxylamine-Pyridoxine 10-10 MG TBEC, Two tablets at bedtime on day 1 and 2; if symptoms persist, take 1 tablet in morning and 2 tablets at bedtime on day 3; if symptoms persist, may increase to 1 tablet in morning, 1 tablet mid-afternoon, and 2 tablets at bedtime on day 4 (maximum: doxylamine 40 mg/pyridoxine 40 mg (4 tablets) per day). (Patient not taking: Reported on 10/16/2019), Disp: 30 tablet, Rfl: 0   metroNIDAZOLE (FLAGYL) 500 MG tablet, Take 1 tablet (500 mg total) by mouth 2 (two) times daily., Disp: 14 tablet, Rfl: 0   Prenatal Vit-Fe Fumarate-FA (PRENATAL VITAMIN PO), Take by mouth. (Patient not taking: Reported on 06/19/2022), Disp: , Rfl:    promethazine (PHENERGAN) 25 MG tablet, Take 1 tablet (25 mg total) by mouth every 6 (six) hours as needed for nausea or vomiting. (Patient not taking: Reported on 07/25/2019), Disp: 30 tablet, Rfl: 0   terconazole (TERAZOL 7) 0.4 % vaginal cream, Place 1 applicator vaginally at bedtime. Use for seven days (Patient  not taking: Reported on 07/25/2019), Disp: 45 g, Rfl: 0   No Known Allergies  Past Medical History:  Diagnosis Date   Allergy    Anemia      History reviewed. No pertinent surgical history.  Family History  Problem Relation Age of Onset   Depression Mother    Ovarian cancer Mother    Anxiety disorder Maternal Aunt    Bipolar disorder Maternal Grandmother    Ovarian cancer Maternal Grandmother     Social History   Tobacco Use   Smoking status: Never   Smokeless tobacco: Never  Vaping Use   Vaping Use: Never used  Substance Use Topics   Alcohol use: Yes    Comment: occ   Drug use: Yes    Types: Marijuana    ROS   Objective:   Vitals: BP 100/67 (BP Location: Left Arm)   Pulse 69   Temp 98.8 F (37.1 C) (Oral)   Resp 18   LMP 09/12/2022 (Exact Date)   SpO2 99%   Physical Exam Constitutional:      General: She is not in acute distress.    Appearance: Normal appearance. She is well-developed. She is not ill-appearing, toxic-appearing or diaphoretic.  HENT:     Head: Normocephalic and atraumatic.     Nose: Nose normal.     Mouth/Throat:     Mouth: Mucous membranes are moist.  Eyes:     General: No scleral icterus.       Right eye: No discharge.        Left eye:  No discharge.     Extraocular Movements: Extraocular movements intact.     Conjunctiva/sclera: Conjunctivae normal.  Cardiovascular:     Rate and Rhythm: Normal rate.  Pulmonary:     Effort: Pulmonary effort is normal.  Abdominal:     General: Bowel sounds are normal. There is no distension.     Palpations: Abdomen is soft. There is no mass.     Tenderness: There is abdominal tenderness (mild, generalized). There is no right CVA tenderness, left CVA tenderness, guarding or rebound.  Skin:    General: Skin is warm and dry.  Neurological:     General: No focal deficit present.     Mental Status: She is alert and oriented to person, place, and time.  Psychiatric:        Mood and Affect: Mood  normal.        Behavior: Behavior normal.        Thought Content: Thought content normal.        Judgment: Judgment normal.     Results for orders placed or performed during the hospital encounter of 09/17/22 (from the past 24 hour(s))  POCT urinalysis dipstick     Status: Abnormal   Collection Time: 09/17/22  9:37 AM  Result Value Ref Range   Color, UA other (A) yellow   Clarity, UA cloudy (A) clear   Glucose, UA negative negative mg/dL   Bilirubin, UA negative negative   Ketones, POC UA negative negative mg/dL   Spec Grav, UA 0.981 1.914 - 1.025   Blood, UA trace-intact (A) negative   pH, UA 6.0 5.0 - 8.0   Protein Ur, POC trace (A) negative mg/dL   Urobilinogen, UA 0.2 0.2 or 1.0 E.U./dL   Nitrite, UA Negative Negative   Leukocytes, UA Trace (A) Negative    Assessment and Plan :   PDMP not reviewed this encounter.  1. Gastroenteritis   2. Nausea vomiting and diarrhea    No signs of an acute abdomen on exam. Will manage for suspected viral gastroenteritis with supportive care.  Recommended patient hydrate well, eat light meals and maintain electrolytes.  Will use Zofran and Imodium for nausea, vomiting and diarrhea.  Vital signs hemodynamically stable and appropriate for outpatient management.  Counseled patient on potential for adverse effects with medications prescribed/recommended today, ER and return-to-clinic precautions discussed, patient verbalized understanding.    Wallis Bamberg, [place] 09/17/22 1132

## 2022-09-17 NOTE — ED Triage Notes (Signed)
Pt c/o n/v/d started 5/28-NAD-steady gait

## 2022-09-17 NOTE — Discharge Instructions (Addendum)

## 2023-01-06 ENCOUNTER — Ambulatory Visit: Payer: BC Managed Care – PPO

## 2023-03-03 ENCOUNTER — Ambulatory Visit
Admission: RE | Admit: 2023-03-03 | Discharge: 2023-03-03 | Disposition: A | Discharge status: Home / Self Care | Payer: BC Managed Care – PPO | Source: Ambulatory Visit

## 2023-03-03 VITALS — BP 95/61 | HR 98 | Temp 98.8°F | Resp 16 | Ht 59.0 in | Wt 121.0 lb

## 2023-03-03 DIAGNOSIS — S46211A Strain of muscle, fascia and tendon of other parts of biceps, right arm, initial encounter: Secondary | ICD-10-CM | POA: Diagnosis not present

## 2023-03-03 MED ORDER — IBUPROFEN 800 MG PO TABS: 800.0000 mg | ORAL_TABLET | Freq: Three times a day (TID) | ORAL | 0 refills | Status: AC | PRN

## 2023-03-03 NOTE — ED Provider Notes (Signed)
[facility]    CSN: [id number] Arrival date & time: 03/03/23  1549      History   Chief Complaint Chief Complaint  Patient presents with   Shoulder Pain    Entered by patient    HPI Miranda Harrison is a 27 y.o. female.   Patient reports she began having pain in her right shoulder on Monday.  Patient reports she know  has elbow discomfort and swelling in her arm.  Pt reports she has a lump in her arm today.  Pt complains of soreness.  No known injury.  Pt has pain with moving shoulder   The history is provided by the patient. No language interpreter was used.  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 No pertinent surgical history.  OB History     Gravida  2   Para  1   Term  1   Preterm      AB      Living  2      SAB      IAB      Ectopic      Multiple      Live Births  2            Home Medications    Prior to Admission medications   Medication Sig Start Date End Date Taking? Authorizing Provider  amoxicillin-clavulanate (AUGMENTIN) 875-125 MG tablet Take 1 tablet by mouth 2 (two) times daily. 01/27/21  Yes [provider]  Butalbital-APAP-Caffeine (FIORICET) 50-300-40 MG CAPS Take 1 capsule by mouth every 6 (six) hours as needed. 02/12/21  Yes [provider]  doxycycline (MONODOX) 100 MG capsule Take 100 mg by mouth 2 (two) times daily. 04/24/21  Yes [provider]  fluticasone (FLONASE) 50 MCG/ACT nasal spray Place 1 spray into both nostrils 2 (two) times daily. 01/27/21  Yes [provider]  ibuprofen (ADVIL) 800 MG tablet Take 1 tablet (800 mg total) by mouth every 8 (eight) hours as needed. 03/03/23  Yes Cheron Schaumann K, PA-C  predniSONE (DELTASONE) 20 MG tablet Take 20 mg by mouth daily with breakfast. 01/27/21  Yes [provider]  tiZANidine (ZANAFLEX) 4 MG tablet Take 4 mg by mouth every 6 (six) hours as needed for muscle spasms (every 6-8 hours PRN (try second) best before bed). 02/12/21  Yes [provider]  acetaminophen (TYLENOL) 325 MG tablet Take 2 tablets (650 mg total) by mouth every 6 (six) hours as needed for moderate pain. 09/17/22   Wallis Bamberg, PA-C  Blood Pressure Monitor KIT 1 Device by Does not apply route once a week. To be monitored Regularly at home. Patient not taking: Reported on 06/19/2022 07/25/19   Leftwich-Kirby, Wilmer Floor, CNM  Doxylamine-Pyridoxine 10-10 MG TBEC Two tablets at bedtime on day 1 and 2; if symptoms persist, take 1 tablet in morning and 2 tablets at bedtime on day 3; if symptoms persist, may increase to 1 tablet in morning, 1 tablet mid-afternoon, and 2 tablets at bedtime on day 4 (maximum: doxylamine 40 mg/pyridoxine  40 mg (4 tablets) per day). Patient not taking: Reported on 10/16/2019 06/15/19   Belinda Fisher, PA-C  loperamide (IMODIUM) 2 MG capsule Take 1 capsule (2 mg total) by mouth 2 (two) times daily as needed for diarrhea or loose stools. 09/17/22   Wallis Bamberg, PA-C  metroNIDAZOLE (FLAGYL) 500 MG tablet Take 1 tablet (500 mg total) by mouth 2 (two) times daily. 09/11/22   Lamptey, Britta Mccreedy, MD  metroNIDAZOLE (FLAGYL) 500 MG tablet Take 500 mg by mouth 2 (two) times daily.    [provider]  ondansetron (ZOFRAN-ODT) 8 MG disintegrating tablet Take 1 tablet (8 mg total) by mouth every 8 (eight) hours as needed for nausea or vomiting. 09/17/22   Wallis Bamberg, PA-C  Prenatal Vit-DSS-Fe Cbn-FA (PRENATAL AD PO) Take 1 tablet by mouth daily.    [provider]  Prenatal Vit-DSS-Fe Cbn-FA (PRENATAL AD PO)  Take 1 tablet by mouth daily.    [provider]  Prenatal Vit-Fe Fumarate-FA (PRENATAL VITAMIN PO) Take by mouth. Patient not taking: Reported on 06/19/2022    [provider]  progesterone (PROMETRIUM) 200 MG capsule Take 200 mg by mouth daily.    [provider]  promethazine (PHENERGAN) 25 MG tablet Take 1 tablet (25 mg total) by mouth every 6 (six) hours as needed for nausea or vomiting. Patient not taking: Reported on 07/25/2019 06/25/19   Calvert Cantor, CNM  terconazole (TERAZOL 7) 0.4 % vaginal cream Place 1 applicator vaginally at bedtime. Use for seven days Patient not taking: Reported on 07/25/2019 06/18/19   Judeth Horn, NP    Family History Family History  Problem Relation Age of Onset   Depression Mother    Ovarian cancer Mother    Anxiety disorder Maternal Aunt    Bipolar disorder Maternal Grandmother    Ovarian cancer Maternal Grandmother     Social History Social History   Tobacco Use   Smoking status: Never   Smokeless tobacco: Never  Vaping Use   Vaping status: Never Used  Substance Use Topics   Alcohol use: Yes    Comment: Occassionally.   Drug use: Yes    Types: Marijuana     Allergies   Patient has no known allergies.   Review of Systems Review of Systems  Musculoskeletal:  Positive for joint swelling and myalgias.  All other systems reviewed and are negative.    Physical Exam Triage Vital Signs ED Triage Vitals  Encounter Vitals Group     BP 03/03/23 1601 95/61     Systolic BP Percentile --      Diastolic BP Percentile --      Pulse Rate 03/03/23 1601 98     Resp 03/03/23 1601 16     Temp 03/03/23 1601 98.8 F (37.1 C)     Temp Source 03/03/23 1601 Oral     SpO2 03/03/23 1601 98 %     Weight 03/03/23 1600 121 lb 0.5 oz (54.9 kg)     Height 03/03/23 1600 4\' 11"  (1.499 m)     Head Circumference --      Peak Flow --      Pain Score 03/03/23 1555 8     Pain Loc --      Pain Education --      Exclude from  Growth Chart --    No data found.  Updated Vital Signs BP 95/61 (BP Location: Left Arm)   Pulse 98   Temp 98.8 F (37.1 C) (Oral)  Resp 16   Ht 4\' 11"  (1.499 m)   Wt 54.9 kg   LMP 02/22/2023 (Exact Date)   SpO2 98%   BMI 24.45 kg/m   Visual Acuity Right Eye Distance:   Left Eye Distance:   Bilateral Distance:    Right Eye Near:   Left Eye Near:    Bilateral Near:     Physical Exam Vitals and nursing note reviewed.  Constitutional:      Appearance: She is well-developed.  HENT:     Head: Normocephalic.  Cardiovascular:     Rate and Rhythm: Normal rate.  Pulmonary:     Effort: Pulmonary effort is normal.  Abdominal:     General: There is no distension.  Musculoskeletal:        General: Swelling and tenderness present.  Skin:    General: Skin is warm.  Neurological:     General: No focal deficit present.     Mental Status: She is alert and oriented to person, place, and time.      UC Treatments / Results  Labs (all labs ordered are listed, but only abnormal results are displayed) Labs Reviewed - No data to display  EKG   Radiology No results found.  Procedures Procedures (including critical care time)  Medications Ordered in UC Medications - No data to display  Initial Impression / Assessment and Plan / UC Course  I have reviewed the triage vital signs and the nursing notes.  Pertinent labs & imaging results that were available during my care of the patient were reviewed by me and considered in my medical decision making (see chart for details).      Final Clinical Impressions(s) / UC Diagnoses   Final diagnoses:  Biceps tendon rupture, proximal, right, initial encounter     Discharge Instructions      Return if any problems.   ED Prescriptions     Medication Sig Dispense Auth. Provider   ibuprofen (ADVIL) 800 MG tablet Take 1 tablet (800 mg total) by mouth every 8 (eight) hours as needed. 30 tablet Elson Areas, [place]       PDMP not reviewed this encounter. An After Visit Summary was printed and given to the patient.       Elson Areas, [place] 03/05/23 2230

## 2023-03-03 NOTE — Discharge Instructions (Addendum)
Return if any problems.

## 2023-03-03 NOTE — ED Triage Notes (Signed)
"  First I have a lump in my bicep (it is sore), my right shoulder is hurting and the pain extends all the way down my arm to my right hand and fingers". No injury known. "I noticed the pain on Monday then yesterday my elbow became stiff and difficult to extend or bend arm, the lump in bicep is warm to touch".

## 2023-03-04 ENCOUNTER — Ambulatory Visit: Payer: Self-pay
